# Patient Record
Sex: Female | Born: 1975 | Race: Black or African American | Hispanic: No | Marital: Single | Smoking: Never smoker
Health system: Southern US, Community
[De-identification: ages and names within clinical notes are randomized; demographics above are authoritative.]

## PROBLEM LIST (undated history)

## (undated) DIAGNOSIS — D649 Anemia, unspecified: Secondary | ICD-10-CM

## (undated) DIAGNOSIS — R7303 Prediabetes: Secondary | ICD-10-CM

## (undated) DIAGNOSIS — E669 Obesity, unspecified: Secondary | ICD-10-CM

## (undated) DIAGNOSIS — E559 Vitamin D deficiency, unspecified: Secondary | ICD-10-CM

## (undated) DIAGNOSIS — K219 Gastro-esophageal reflux disease without esophagitis: Secondary | ICD-10-CM

## (undated) DIAGNOSIS — E785 Hyperlipidemia, unspecified: Secondary | ICD-10-CM

## (undated) HISTORY — DX: Hyperlipidemia, unspecified: E78.5

## (undated) HISTORY — DX: Gastro-esophageal reflux disease without esophagitis: K21.9

## (undated) HISTORY — DX: Anemia, unspecified: D64.9

## (undated) HISTORY — DX: Prediabetes: R73.03

## (undated) HISTORY — DX: Obesity, unspecified: E66.9

## (undated) HISTORY — DX: Vitamin D deficiency, unspecified: E55.9

---

## 1998-02-28 ENCOUNTER — Other Ambulatory Visit: Admission: RE | Admit: 1998-02-28 | Discharge: 1998-02-28 | Payer: Self-pay | Admitting: *Deleted

## 2003-10-02 ENCOUNTER — Emergency Department (HOSPITAL_COMMUNITY): Admission: EM | Admit: 2003-10-02 | Discharge: 2003-10-02 | Payer: Self-pay | Admitting: Emergency Medicine

## 2004-11-02 ENCOUNTER — Emergency Department (HOSPITAL_COMMUNITY): Admission: EM | Admit: 2004-11-02 | Discharge: 2004-11-02 | Payer: Self-pay | Admitting: Emergency Medicine

## 2006-02-10 ENCOUNTER — Other Ambulatory Visit: Admission: RE | Admit: 2006-02-10 | Discharge: 2006-02-10 | Payer: Self-pay | Admitting: Internal Medicine

## 2007-02-21 ENCOUNTER — Other Ambulatory Visit: Admission: RE | Admit: 2007-02-21 | Discharge: 2007-02-21 | Payer: Self-pay | Admitting: Internal Medicine

## 2007-05-16 ENCOUNTER — Emergency Department (HOSPITAL_COMMUNITY): Admission: EM | Admit: 2007-05-16 | Discharge: 2007-05-16 | Payer: Self-pay | Admitting: *Deleted

## 2007-05-23 ENCOUNTER — Other Ambulatory Visit: Admission: RE | Admit: 2007-05-23 | Discharge: 2007-05-23 | Payer: Self-pay | Admitting: Internal Medicine

## 2008-04-11 ENCOUNTER — Other Ambulatory Visit: Admission: RE | Admit: 2008-04-11 | Discharge: 2008-04-11 | Payer: Self-pay | Admitting: Internal Medicine

## 2011-09-01 ENCOUNTER — Other Ambulatory Visit (HOSPITAL_COMMUNITY)
Admission: RE | Admit: 2011-09-01 | Discharge: 2011-09-01 | Disposition: A | Discharge status: Home / Self Care | Payer: BC Managed Care – PPO | Source: Ambulatory Visit | Attending: Internal Medicine | Admitting: Internal Medicine

## 2011-09-01 DIAGNOSIS — Z01419 Encounter for gynecological examination (general) (routine) without abnormal findings: Secondary | ICD-10-CM | POA: Insufficient documentation

## 2011-09-01 DIAGNOSIS — Z1159 Encounter for screening for other viral diseases: Secondary | ICD-10-CM | POA: Insufficient documentation

## 2011-09-01 DIAGNOSIS — N760 Acute vaginitis: Secondary | ICD-10-CM | POA: Insufficient documentation

## 2014-03-01 ENCOUNTER — Ambulatory Visit: Payer: Self-pay | Admitting: Physician Assistant

## 2014-03-01 ENCOUNTER — Ambulatory Visit (INDEPENDENT_AMBULATORY_CARE_PROVIDER_SITE_OTHER): Payer: BC Managed Care – PPO | Admitting: Physician Assistant

## 2014-03-01 ENCOUNTER — Encounter: Payer: Self-pay | Admitting: Physician Assistant

## 2014-03-01 VITALS — BP 110/68 | HR 88 | Temp 97.7°F | Resp 16 | Ht 65.0 in | Wt 199.0 lb

## 2014-03-01 DIAGNOSIS — R7303 Prediabetes: Secondary | ICD-10-CM

## 2014-03-01 DIAGNOSIS — D509 Iron deficiency anemia, unspecified: Secondary | ICD-10-CM

## 2014-03-01 DIAGNOSIS — E559 Vitamin D deficiency, unspecified: Secondary | ICD-10-CM

## 2014-03-01 DIAGNOSIS — R7309 Other abnormal glucose: Secondary | ICD-10-CM

## 2014-03-01 DIAGNOSIS — Z0000 Encounter for general adult medical examination without abnormal findings: Secondary | ICD-10-CM

## 2014-03-01 DIAGNOSIS — Z0001 Encounter for general adult medical examination with abnormal findings: Secondary | ICD-10-CM

## 2014-03-01 DIAGNOSIS — E669 Obesity, unspecified: Secondary | ICD-10-CM

## 2014-03-01 DIAGNOSIS — Z79899 Other long term (current) drug therapy: Secondary | ICD-10-CM

## 2014-03-01 DIAGNOSIS — E785 Hyperlipidemia, unspecified: Secondary | ICD-10-CM

## 2014-03-01 LAB — CBC WITH DIFFERENTIAL/PLATELET
BASOS ABS: 0 10*3/uL (ref 0.0–0.1)
Basophils Relative: 0 % (ref 0–1)
EOS ABS: 0.1 10*3/uL (ref 0.0–0.7)
Eosinophils Relative: 2 % (ref 0–5)
HCT: 35.7 % — ABNORMAL LOW (ref 36.0–46.0)
Hemoglobin: 11.3 g/dL — ABNORMAL LOW (ref 12.0–15.0)
LYMPHS PCT: 34 % (ref 12–46)
Lymphs Abs: 1.7 10*3/uL (ref 0.7–4.0)
MCH: 23.3 pg — ABNORMAL LOW (ref 26.0–34.0)
MCHC: 31.7 g/dL (ref 30.0–36.0)
MCV: 73.8 fL — AB (ref 78.0–100.0)
Monocytes Absolute: 0.4 10*3/uL (ref 0.1–1.0)
Monocytes Relative: 7 % (ref 3–12)
NEUTROS ABS: 2.9 10*3/uL (ref 1.7–7.7)
Neutrophils Relative %: 57 % (ref 43–77)
Platelets: 422 10*3/uL — ABNORMAL HIGH (ref 150–400)
RBC: 4.84 MIL/uL (ref 3.87–5.11)
RDW: 16.6 % — AB (ref 11.5–15.5)
WBC: 5 10*3/uL (ref 4.0–10.5)

## 2014-03-01 LAB — HEPATIC FUNCTION PANEL
ALK PHOS: 98 U/L (ref 39–117)
ALT: 10 U/L (ref 0–35)
AST: 12 U/L (ref 0–37)
Albumin: 4.1 g/dL (ref 3.5–5.2)
BILIRUBIN DIRECT: 0.1 mg/dL (ref 0.0–0.3)
BILIRUBIN INDIRECT: 0.4 mg/dL (ref 0.2–1.2)
Total Bilirubin: 0.5 mg/dL (ref 0.2–1.2)
Total Protein: 7.5 g/dL (ref 6.0–8.3)

## 2014-03-01 LAB — BASIC METABOLIC PANEL WITH GFR
BUN: 8 mg/dL (ref 6–23)
CALCIUM: 9.8 mg/dL (ref 8.4–10.5)
CHLORIDE: 99 meq/L (ref 96–112)
CO2: 26 mEq/L (ref 19–32)
Creat: 0.94 mg/dL (ref 0.50–1.10)
GFR, Est Non African American: 78 mL/min
GLUCOSE: 100 mg/dL — AB (ref 70–99)
POTASSIUM: 3.9 meq/L (ref 3.5–5.3)
Sodium: 137 mEq/L (ref 135–145)

## 2014-03-01 LAB — TSH: TSH: 1.46 u[IU]/mL (ref 0.350–4.500)

## 2014-03-01 LAB — LIPID PANEL
Cholesterol: 200 mg/dL (ref 0–200)
HDL: 45 mg/dL (ref >39)
LDL Cholesterol: 119 mg/dL — ABNORMAL HIGH (ref 0–99)
Total CHOL/HDL Ratio: 4.4 Ratio
Triglycerides: 178 mg/dL — ABNORMAL HIGH (ref <150)
VLDL: 36 mg/dL (ref 0–40)

## 2014-03-01 LAB — MAGNESIUM: MAGNESIUM: 1.8 mg/dL (ref 1.5–2.5)

## 2014-03-01 LAB — HEMOGLOBIN A1C
HEMOGLOBIN A1C: 6.2 % — AB (ref <5.7)
MEAN PLASMA GLUCOSE: 131 mg/dL — AB (ref <117)

## 2014-03-01 MED ORDER — ACYCLOVIR 400 MG PO TABS: 400.0000 mg | ORAL_TABLET | Freq: Every day | ORAL | Status: DC

## 2014-03-01 NOTE — Patient Instructions (Signed)
   Bad carbs also include fruit juice, alcohol, and sweet tea. These are empty calories that do not signal to your brain that you are full.   Please remember the good carbs are still carbs which convert into sugar. So please measure them out no more than 1/2-1 cup of rice, oatmeal, pasta, and beans.  Veggies are however free foods! Pile them on.   I like lean protein at every meal such as chicken, [place], pork chops, cottage cheese, etc. Just do not fry these meats and please center your meal around vegetable, the meats should be a side dish.   No all fruit is created equal. Please see the list below, the fruit at the bottom is higher in sugars than the fruit at the top   Eardrum Perforation The eardrum is a thin, round tissue inside the ear that separates the ear canal from the middle ear. This is the tissue that detects sound and enables you to hear. The eardrum can be punctured or torn (perforated). Eardrums generally heal without help and with little or no permanent hearing loss. HOME CARE INSTRUCTIONS   Keep your ear dry, as this improves healing. Swimming, diving, and showers are not allowed until healing is complete. While bathing, protect the ear by placing a piece of cotton covered with petroleum jelly in the outer ear canal.  Only take over-the-counter or prescription medicines for pain, discomfort, or fever as directed by your caregiver.  Blow your nose gently. Forceful blowing increases the pressure in the middle ear and may cause further injury or delay healing.  Resume normal activities, such as showering, when the perforation has healed. Your caregiver can let you know when this has occurred.  Talk to your caregiver before flying on an airplane. Air travel is generally allowed with a perforated eardrum.  If your caregiver has given you a follow-up appointment, it is very important to keep that appointment. Failure to keep the appointment could result in a chronic or permanent  injury, pain, hearing loss, and disability. SEEK IMMEDIATE MEDICAL CARE IF:   You have bleeding or pus coming from your ear.  You have problems with balance, dizziness, nausea, or vomiting.  You develop increased pain.  You have a fever. MAKE SURE YOU:   Understand these instructions.  Will watch your condition.  Will get help right away if you are not doing well or get worse. Document Released: 06/12/2000 Document Revised: 09/07/2011 Document Reviewed: 06/14/2008 [facility] Patient Information 2015 [place], [place]. This information is not intended to replace advice given to you by your health care provider. Make sure you discuss any questions you have with your health care provider.

## 2014-03-01 NOTE — Progress Notes (Addendum)
Assessment and Plan:  Hypertension: Continue medication, monitor blood pressure at home. Continue DASH diet. Cholesterol: Continue diet and exercise. Check cholesterol.  Pre-diabetes-Continue diet and exercise. Check A1C Vitamin D Def- check level and continue medications.  Anemia- check CBC HSV1- acyclovir sent in Obesity with co morbidities- long discussion about weight loss, diet, and exercise- STOP sodas   Continue diet and meds as discussed. Further disposition pending results of labs.  HPI 38 y.o. female  presents for complete physical and follow up with hypertension, hyperlipidemia, prediabetes and vitamin D. Her blood pressure has been controlled at home, today their BP is BP: 110/68 mmHg She does not workout. She denies chest pain, shortness of breath, dizziness.  She is not on cholesterol medication and denies myalgias. Her cholesterol is at goal. The cholesterol last visit was:  LDL 129 She has been working on diet and exercise for prediabetes, and denies polydipsia, polyuria and visual disturbances. Last A1C in the office was: 6.2 Patient is on Vitamin D supplement.  Last vitamin D 20.  She has iron def anemia with last iron 18, H/H was 10.5/32.4. LMP 3 weeks ago, heavy for first day or 2 but not bad. BMI is Body mass index is 33.12 kg/(m^2)., She is struggling with weight loss due to stress.  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   Current Medications:  No current outpatient prescriptions on file prior to visit.   No current facility-administered medications on file prior to visit.   Medical History:  Past Medical History  Diagnosis Date  . Prediabetes   . Hyperlipemia   . Vitamin D deficiency   . Obesity   . Anemia   . GERD (gastroesophageal reflux disease)    Allergies:  Allergies  Allergen Reactions  . Benadryl [Diphenhydramine] Rash    No past surgical history on  file.  Family History  Problem Relation Age of Onset  . Asthma Mother   . Hypertension Mother   . Hypertension Father   . Cancer Father     Prostate  . Stroke Paternal Grandfather    Allergies  Allergen Reactions  . Benadryl [Diphenhydramine] Rash   History   Social History  . Marital Status: Single    Spouse Name: N/A    Number of Children: N/A  . Years of Education: N/A   Occupational History  . Not on file.   Social History Main Topics  . Smoking status: Never Smoker   . Smokeless tobacco: Never Used  . Alcohol Use: No  . Drug Use: No  . Sexual Activity: Not on file   Other Topics Concern  . Not on file   Social History Narrative     Review of Systems:  = complains of   = denies  General: Fatigue  Fever  Chills  Weakness   Insomnia  Eyes: Redness  Blurred vision  Diplopia   ENT: Congestion  Sinus Pain  Post Nasal Drip  Sore Throat  Earache [x ]  Cardiac: Chest pain/pressure  SOB  Orthopnea   Palpitations   Paroxysmal nocturnal dyspnea[ ]  Claudication  Edema   Pulmonary: Cough  Wheezing[ ]   SOB   Snoring   GI: Nausea  Vomiting[ ]  Dysphagia[ ]  Heartburn[ ]  Abdominal pain  Constipation ;  Diarrhea ; BRBPR  Melena[ ]  GU: Hematuria[ ]  Dysuria  Nocturia[ ]  Urgency   Hesitancy  Discharge  Neuro: Headaches[ ]  Vertigo[ ]  Paresthesias[ ]  Spasm  Speech changes  Incoordination   Ortho: Arthritis  Joint pain  Muscle pain  Joint swelling  Back Pain  Skin:  Rash   Pruritis  Change in skin lesion   Psych: Depression[ ]  Anxiety[ ]  Confusion  Memory loss   Heme/Lypmh: Bleeding  Bruising  Enlarged lymph nodes   Endocrine: Visual blurring  Paresthesia  Polyuria  Polydypsea    Heat/cold intolerance  Hypoglycemia   Physical Exam: BP 110/68 mmHg  Pulse 88  Temp(Src) 97.7 F (36.5 C)  Resp 16  Ht  (1.651 m)  Wt  199 lb (90.266 kg)  BMI 33.12 kg/m2  LMP 02/15/2014 Wt Readings from Last 3 Encounters:  03/01/14 199 lb (90.266 kg)   General Appearance: Well nourished, in no apparent distress. Eyes: PERRLA, EOMs, conjunctiva no swelling or erythema Sinuses: No Frontal/maxillary tenderness ENT/Mouth: Ext aud canals clear, TMs without erythema, bulging. No erythema, swelling, or exudate on post pharynx.  Tonsils not swollen or erythematous. Hearing normal.  Neck: Supple, thyroid normal.  Respiratory: Respiratory effort normal, BS equal bilaterally without rales, rhonchi, wheezing or stridor.  Cardio: RRR with no MRGs. Brisk peripheral pulses without edema.  Abdomen: Soft, + BS.  Non tender, no guarding, rebound, hernias, masses. Lymphatics: Non tender without lymphadenopathy.  Musculoskeletal: Full ROM, 5/5 strength, normal gait.  Skin: Warm, dry without rashes, lesions, ecchymosis.  Neuro: Cranial nerves intact. Normal muscle tone, no cerebellar symptoms. Sensation intact.  Psych: Awake and oriented X 3, normal affect, Insight and Judgment appropriate.    Quentin Mulling 1:22 PM

## 2014-03-02 LAB — VITAMIN D 25 HYDROXY (VIT D DEFICIENCY, FRACTURES): VIT D 25 HYDROXY: 19 ng/mL — AB (ref 30–89)

## 2014-05-31 ENCOUNTER — Ambulatory Visit: Payer: Self-pay | Admitting: Physician Assistant

## 2014-06-04 ENCOUNTER — Encounter: Payer: Self-pay | Admitting: Physician Assistant

## 2014-06-04 NOTE — Addendum Note (Signed)
Addended by: Quentin MullingOLLIER, Zakiah Beckerman R on: 06/04/2014 01:23 PM   Modules accepted: Level of Service

## 2015-03-26 ENCOUNTER — Other Ambulatory Visit: Payer: Self-pay | Admitting: Physician Assistant

## 2015-03-26 MED ORDER — ACYCLOVIR 400 MG PO TABS: 400.0000 mg | ORAL_TABLET | Freq: Every day | ORAL | Status: DC

## 2015-03-27 ENCOUNTER — Other Ambulatory Visit: Payer: Self-pay

## 2015-04-01 ENCOUNTER — Other Ambulatory Visit (HOSPITAL_COMMUNITY)
Admission: RE | Admit: 2015-04-01 | Discharge: 2015-04-01 | Disposition: A | Discharge status: Home / Self Care | Payer: BLUE CROSS/BLUE SHIELD | Source: Ambulatory Visit | Attending: Physician Assistant | Admitting: Physician Assistant

## 2015-04-01 ENCOUNTER — Ambulatory Visit (INDEPENDENT_AMBULATORY_CARE_PROVIDER_SITE_OTHER): Payer: BLUE CROSS/BLUE SHIELD | Admitting: Physician Assistant

## 2015-04-01 ENCOUNTER — Encounter: Payer: Self-pay | Admitting: Physician Assistant

## 2015-04-01 VITALS — BP 128/80 | HR 88 | Temp 97.5°F | Resp 14 | Ht 65.0 in | Wt 192.0 lb

## 2015-04-01 DIAGNOSIS — Z23 Encounter for immunization: Secondary | ICD-10-CM

## 2015-04-01 DIAGNOSIS — Z1151 Encounter for screening for human papillomavirus (HPV): Secondary | ICD-10-CM | POA: Insufficient documentation

## 2015-04-01 DIAGNOSIS — Z79899 Other long term (current) drug therapy: Secondary | ICD-10-CM | POA: Diagnosis not present

## 2015-04-01 DIAGNOSIS — E785 Hyperlipidemia, unspecified: Secondary | ICD-10-CM

## 2015-04-01 DIAGNOSIS — E669 Obesity, unspecified: Secondary | ICD-10-CM

## 2015-04-01 DIAGNOSIS — Z01411 Encounter for gynecological examination (general) (routine) with abnormal findings: Secondary | ICD-10-CM | POA: Diagnosis present

## 2015-04-01 DIAGNOSIS — E559 Vitamin D deficiency, unspecified: Secondary | ICD-10-CM | POA: Diagnosis not present

## 2015-04-01 DIAGNOSIS — B009 Herpesviral infection, unspecified: Secondary | ICD-10-CM

## 2015-04-01 DIAGNOSIS — Z124 Encounter for screening for malignant neoplasm of cervix: Secondary | ICD-10-CM | POA: Diagnosis not present

## 2015-04-01 DIAGNOSIS — R87612 Low grade squamous intraepithelial lesion on cytologic smear of cervix (LGSIL): Secondary | ICD-10-CM

## 2015-04-01 DIAGNOSIS — K219 Gastro-esophageal reflux disease without esophagitis: Secondary | ICD-10-CM | POA: Insufficient documentation

## 2015-04-01 DIAGNOSIS — R7303 Prediabetes: Secondary | ICD-10-CM | POA: Insufficient documentation

## 2015-04-01 DIAGNOSIS — Z0001 Encounter for general adult medical examination with abnormal findings: Secondary | ICD-10-CM

## 2015-04-01 DIAGNOSIS — D509 Iron deficiency anemia, unspecified: Secondary | ICD-10-CM

## 2015-04-01 DIAGNOSIS — Z0000 Encounter for general adult medical examination without abnormal findings: Secondary | ICD-10-CM | POA: Diagnosis not present

## 2015-04-01 LAB — IRON AND TIBC
%SAT: 7 % — ABNORMAL LOW (ref 11–50)
Iron: 30 ug/dL — ABNORMAL LOW (ref 40–190)
TIBC: 433 ug/dL (ref 250–450)
UIBC: 403 ug/dL — ABNORMAL HIGH (ref 125–400)

## 2015-04-01 LAB — CBC WITH DIFFERENTIAL/PLATELET
Basophils Absolute: 0.1 K/uL (ref 0.0–0.1)
Basophils Relative: 1 % (ref 0–1)
Eosinophils Absolute: 0.1 K/uL (ref 0.0–0.7)
Eosinophils Relative: 2 % (ref 0–5)
HCT: 34.8 % — ABNORMAL LOW (ref 36.0–46.0)
Hemoglobin: 10.8 g/dL — ABNORMAL LOW (ref 12.0–15.0)
Lymphocytes Relative: 40 % (ref 12–46)
Lymphs Abs: 2.2 K/uL (ref 0.7–4.0)
MCH: 23.1 pg — ABNORMAL LOW (ref 26.0–34.0)
MCHC: 31 g/dL (ref 30.0–36.0)
MCV: 74.5 fL — ABNORMAL LOW (ref 78.0–100.0)
MPV: 9.8 fL (ref 8.6–12.4)
Monocytes Absolute: 0.4 K/uL (ref 0.1–1.0)
Monocytes Relative: 8 % (ref 3–12)
Neutro Abs: 2.7 K/uL (ref 1.7–7.7)
Neutrophils Relative %: 49 % (ref 43–77)
Platelets: 438 K/uL — ABNORMAL HIGH (ref 150–400)
RBC: 4.67 MIL/uL (ref 3.87–5.11)
RDW: 16.3 % — ABNORMAL HIGH (ref 11.5–15.5)
WBC: 5.5 K/uL (ref 4.0–10.5)

## 2015-04-01 LAB — HEPATIC FUNCTION PANEL
ALT: 9 U/L (ref 6–29)
AST: 14 U/L (ref 10–30)
Albumin: 4.2 g/dL (ref 3.6–5.1)
Alkaline Phosphatase: 90 U/L (ref 33–115)
Bilirubin, Direct: 0.1 mg/dL
Indirect Bilirubin: 0.3 mg/dL (ref 0.2–1.2)
Total Bilirubin: 0.4 mg/dL (ref 0.2–1.2)
Total Protein: 7.7 g/dL (ref 6.1–8.1)

## 2015-04-01 LAB — BASIC METABOLIC PANEL WITHOUT GFR
BUN: 9 mg/dL (ref 7–25)
CO2: 26 mmol/L (ref 20–31)
Calcium: 9.8 mg/dL (ref 8.6–10.2)
Chloride: 104 mmol/L (ref 98–110)
Creat: 0.86 mg/dL (ref 0.50–1.10)
GFR, Est African American: >89 mL/min
GFR, Est Non African American: 87 mL/min
Glucose, Bld: 77 mg/dL (ref 65–99)
Potassium: 4.3 mmol/L (ref 3.5–5.3)
Sodium: 142 mmol/L (ref 135–146)

## 2015-04-01 LAB — LIPID PANEL
Cholesterol: 203 mg/dL — ABNORMAL HIGH (ref 125–200)
HDL: 40 mg/dL — ABNORMAL LOW
LDL Cholesterol: 133 mg/dL — ABNORMAL HIGH
Total CHOL/HDL Ratio: 5.1 ratio — ABNORMAL HIGH
Triglycerides: 148 mg/dL
VLDL: 30 mg/dL

## 2015-04-01 LAB — HEMOGLOBIN A1C
Hgb A1c MFr Bld: 6 % — ABNORMAL HIGH
Mean Plasma Glucose: 126 mg/dL — ABNORMAL HIGH

## 2015-04-01 LAB — MAGNESIUM: Magnesium: 2 mg/dL (ref 1.5–2.5)

## 2015-04-01 MED ORDER — ACYCLOVIR 400 MG PO TABS: 400.0000 mg | ORAL_TABLET | Freq: Every day | ORAL | Status: DC

## 2015-04-01 NOTE — Addendum Note (Signed)
Addended by: Quentin Mulling R on: 04/01/2015 12:39 PM   Modules accepted: Karina Berry

## 2015-04-01 NOTE — Addendum Note (Signed)
Addended by: Quentin Mulling R on: 04/01/2015 12:40 PM   Modules accepted: Kipp Brood
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
## 2015-04-01 NOTE — Patient Instructions (Signed)
Preventive Care for Adults A healthy lifestyle and preventive care can promote health and wellness. Preventive health guidelines for women include the following key practices.  A routine yearly physical is a good way to check with your health care provider about your health and preventive screening. It is a chance to share any concerns and updates on your health and to receive a thorough exam.  Visit your dentist for a routine exam and preventive care every 6 months. Brush your teeth twice a day and floss once a day. Good oral hygiene prevents tooth decay and gum disease.  The frequency of eye exams is based on your age, health, family medical history, use of contact lenses, and other factors. Follow your health care provider's recommendations for frequency of eye exams.  Eat a healthy diet. Foods like vegetables, fruits, whole grains, low-fat dairy products, and lean protein foods contain the nutrients you need without too many calories. Decrease your intake of foods high in solid fats, added sugars, and salt. Eat the right amount of calories for you.Get information about a proper diet from your health care provider, if necessary.  Regular physical exercise is one of the most important things you can do for your health. Most adults should get at least 150 minutes of moderate-intensity exercise (any activity that increases your heart rate and causes you to sweat) each week. In addition, most adults need muscle-strengthening exercises on 2 or more days a week.  Maintain a healthy weight. The body mass index (BMI) is a screening tool to identify possible weight problems. It provides an estimate of body fat based on height and weight. Your health care provider can find your BMI and can help you achieve or maintain a healthy weight.For adults 20 years and older:  A BMI below 18.5 is considered underweight.  A BMI of 18.5 to 24.9 is normal.  A BMI of 25 to 29.9 is considered overweight.  A BMI of  30 and above is considered obese.  Maintain normal blood lipids and cholesterol levels by exercising and minimizing your intake of saturated fat. Eat a balanced diet with plenty of fruit and vegetables. Blood tests for lipids and cholesterol should begin at age 76 and be repeated every 5 years. If your lipid or cholesterol levels are high, you are over 50, or you are at high risk for heart disease, you may need your cholesterol levels checked more frequently.Ongoing high lipid and cholesterol levels should be treated with medicines if diet and exercise are not working.  If you smoke, find out from your health care provider how to quit. If you do not use tobacco, do not start.  Lung cancer screening is recommended for adults aged 22-80 years who are at high risk for developing lung cancer because of a history of smoking. A yearly low-dose CT scan of the lungs is recommended for people who have at least a 30-pack-year history of smoking and are a current smoker or have quit within the past 15 years. A pack year of smoking is smoking an average of 1 pack of cigarettes a day for 1 year (for example: 1 pack a day for 30 years or 2 packs a day for 15 years). Yearly screening should continue until the smoker has stopped smoking for at least 15 years. Yearly screening should be stopped for people who develop a health problem that would prevent them from having lung cancer treatment.  If you are pregnant, do not drink alcohol. If you are breastfeeding,  be very cautious about drinking alcohol. If you are not pregnant and choose to drink alcohol, do not have more than 1 drink per day. One drink is considered to be 12 ounces (355 mL) of beer, 5 ounces (148 mL) of wine, or 1.5 ounces (44 mL) of liquor.  Avoid use of street drugs. Do not share needles with anyone. Ask for help if you need support or instructions about stopping the use of drugs.  High blood pressure causes heart disease and increases the risk of  stroke. Your blood pressure should be checked at least every 1 to 2 years. Ongoing high blood pressure should be treated with medicines if weight loss and exercise do not work.  If you are 3-86 years old, ask your health care provider if you should take aspirin to prevent strokes.  Diabetes screening involves taking a blood sample to check your fasting blood sugar level. This should be done once every 3 years, after age 67, if you are within normal weight and without risk factors for diabetes. Testing should be considered at a younger age or be carried out more frequently if you are overweight and have at least 1 risk factor for diabetes.  Breast cancer screening is essential preventive care for women. You should practice "breast self-awareness." This means understanding the normal appearance and feel of your breasts and may include breast self-examination. Any changes detected, no matter how small, should be reported to a health care provider. Women in their 8s and 30s should have a clinical breast exam (CBE) by a health care provider as part of a regular health exam every 1 to 3 years. After age 70, women should have a CBE every year. Starting at age 25, women should consider having a mammogram (breast X-ray test) every year. Women who have a family history of breast cancer should talk to their health care provider about genetic screening. Women at a high risk of breast cancer should talk to their health care providers about having an MRI and a mammogram every year.  Breast cancer gene (BRCA)-related cancer risk assessment is recommended for women who have family members with BRCA-related cancers. BRCA-related cancers include breast, ovarian, tubal, and peritoneal cancers. Having family members with these cancers may be associated with an increased risk for harmful changes (mutations) in the breast cancer genes BRCA1 and BRCA2. Results of the assessment will determine the need for genetic counseling and  BRCA1 and BRCA2 testing.  Routine pelvic exams to screen for cancer are no longer recommended for nonpregnant women who are considered low risk for cancer of the pelvic organs (ovaries, uterus, and vagina) and who do not have symptoms. Ask your health care provider if a screening pelvic exam is right for you.  If you have had past treatment for cervical cancer or a condition that could lead to cancer, you need Pap tests and screening for cancer for at least 20 years after your treatment. If Pap tests have been discontinued, your risk factors (such as having a new sexual partner) need to be reassessed to determine if screening should be resumed. Some women have medical problems that increase the chance of getting cervical cancer. In these cases, your health care provider may recommend more frequent screening and Pap tests.  The HPV test is an additional test that may be used for cervical cancer screening. The HPV test looks for the virus that can cause the cell changes on the cervix. The cells collected during the Pap test can be  tested for HPV. The HPV test could be used to screen women aged 30 years and older, and should be used in women of any age who have unclear Pap test results. After the age of 30, women should have HPV testing at the same frequency as a Pap test.  Colorectal cancer can be detected and often prevented. Most routine colorectal cancer screening begins at the age of 50 years and continues through age 75 years. However, your health care provider may recommend screening at an earlier age if you have risk factors for colon cancer. On a yearly basis, your health care provider may provide home test kits to check for hidden blood in the stool. Use of a small camera at the end of a tube, to directly examine the colon (sigmoidoscopy or colonoscopy), can detect the earliest forms of colorectal cancer. Talk to your health care provider about this at age 50, when routine screening begins. Direct  exam of the colon should be repeated every 5-10 years through age 75 years, unless early forms of pre-cancerous polyps or small growths are found.  People who are at an increased risk for hepatitis B should be screened for this virus. You are considered at high risk for hepatitis B if:  You were born in a country where hepatitis B occurs often. Talk with your health care provider about which countries are considered high risk.  Your parents were born in a high-risk country and you have not received a shot to protect against hepatitis B (hepatitis B vaccine).  You have HIV or AIDS.  You use needles to inject street drugs.  You live with, or have sex with, someone who has hepatitis B.  You get hemodialysis treatment.  You take certain medicines for conditions like cancer, organ transplantation, and autoimmune conditions.  Hepatitis C blood testing is recommended for all people born from 1945 through 1965 and any individual with known risks for hepatitis C.  Practice safe sex. Use condoms and avoid high-risk sexual practices to reduce the spread of sexually transmitted infections (STIs). STIs include gonorrhea, chlamydia, syphilis, trichomonas, herpes, HPV, and human immunodeficiency virus (HIV). Herpes, HIV, and HPV are viral illnesses that have no cure. They can result in disability, cancer, and death.  You should be screened for sexually transmitted illnesses (STIs) including gonorrhea and chlamydia if:  You are sexually active and are younger than 24 years.  You are older than 24 years and your health care provider tells you that you are at risk for this type of infection.  Your sexual activity has changed since you were last screened and you are at an increased risk for chlamydia or gonorrhea. Ask your health care provider if you are at risk.  If you are at risk of being infected with HIV, it is recommended that you take a prescription medicine daily to prevent HIV infection. This is  called preexposure prophylaxis (PrEP). You are considered at risk if:  You are a heterosexual woman, are sexually active, and are at increased risk for HIV infection.  You take drugs by injection.  You are sexually active with a partner who has HIV.  Talk with your health care provider about whether you are at high risk of being infected with HIV. If you choose to begin PrEP, you should first be tested for HIV. You should then be tested every 3 months for as long as you are taking PrEP.  Osteoporosis is a disease in which the bones lose minerals and strength   with aging. This can result in serious bone fractures or breaks. The risk of osteoporosis can be identified using a bone density scan. Women ages 65 years and over and women at risk for fractures or osteoporosis should discuss screening with their health care providers. Ask your health care provider whether you should take a calcium supplement or vitamin D to reduce the rate of osteoporosis.  Menopause can be associated with physical symptoms and risks. Hormone replacement therapy is available to decrease symptoms and risks. You should talk to your health care provider about whether hormone replacement therapy is right for you.  Use sunscreen. Apply sunscreen liberally and repeatedly throughout the day. You should seek shade when your shadow is shorter than you. Protect yourself by wearing long sleeves, pants, a wide-brimmed hat, and sunglasses year round, whenever you are outdoors.  Once a month, do a whole body skin exam, using a mirror to look at the skin on your back. Tell your health care provider of new moles, moles that have irregular borders, moles that are larger than a pencil eraser, or moles that have changed in shape or color.  Stay current with required vaccines (immunizations).  Influenza vaccine. All adults should be immunized every year.  Tetanus, diphtheria, and acellular pertussis (Td, Tdap) vaccine. Pregnant women should  receive 1 dose of Tdap vaccine during each pregnancy. The dose should be obtained regardless of the length of time since the last dose. Immunization is preferred during the 27th-36th week of gestation. An adult who has not previously received Tdap or who does not know her vaccine status should receive 1 dose of Tdap. This initial dose should be followed by tetanus and diphtheria toxoids (Td) booster doses every 10 years. Adults with an unknown or incomplete history of completing a 3-dose immunization series with Td-containing vaccines should begin or complete a primary immunization series including a Tdap dose. Adults should receive a Td booster every 10 years.  Varicella vaccine. An adult without evidence of immunity to varicella should receive 2 doses or a second dose if she has previously received 1 dose. Pregnant females who do not have evidence of immunity should receive the first dose after pregnancy. This first dose should be obtained before leaving the health care facility. The second dose should be obtained 4-8 weeks after the first dose.  Human papillomavirus (HPV) vaccine. Females aged 13-26 years who have not received the vaccine previously should obtain the 3-dose series. The vaccine is not recommended for use in pregnant females. However, pregnancy testing is not needed before receiving a dose. If a female is found to be pregnant after receiving a dose, no treatment is needed. In that case, the remaining doses should be delayed until after the pregnancy. Immunization is recommended for any person with an immunocompromised condition through the age of 26 years if she did not get any or all doses earlier. During the 3-dose series, the second dose should be obtained 4-8 weeks after the first dose. The third dose should be obtained 24 weeks after the first dose and 16 weeks after the second dose.  Zoster vaccine. One dose is recommended for adults aged 60 years or older unless certain conditions are  present.  Measles, mumps, and rubella (MMR) vaccine. Adults born before 1957 generally are considered immune to measles and mumps. Adults born in 1957 or later should have 1 or more doses of MMR vaccine unless there is a contraindication to the vaccine or there is laboratory evidence of immunity to   each of the three diseases. A routine second dose of MMR vaccine should be obtained at least 28 days after the first dose for students attending postsecondary schools, health care workers, or international travelers. People who received inactivated measles vaccine or an unknown type of measles vaccine during 1963-1967 should receive 2 doses of MMR vaccine. People who received inactivated mumps vaccine or an unknown type of mumps vaccine before 1979 and are at high risk for mumps infection should consider immunization with 2 doses of MMR vaccine. For females of childbearing age, rubella immunity should be determined. If there is no evidence of immunity, females who are not pregnant should be vaccinated. If there is no evidence of immunity, females who are pregnant should delay immunization until after pregnancy. Unvaccinated health care workers born before 1957 who lack laboratory evidence of measles, mumps, or rubella immunity or laboratory confirmation of disease should consider measles and mumps immunization with 2 doses of MMR vaccine or rubella immunization with 1 dose of MMR vaccine.  Pneumococcal 13-valent conjugate (PCV13) vaccine. When indicated, a person who is uncertain of her immunization history and has no record of immunization should receive the PCV13 vaccine. An adult aged 19 years or older who has certain medical conditions and has not been previously immunized should receive 1 dose of PCV13 vaccine. This PCV13 should be followed with a dose of pneumococcal polysaccharide (PPSV23) vaccine. The PPSV23 vaccine dose should be obtained at least 8 weeks after the dose of PCV13 vaccine. An adult aged 19  years or older who has certain medical conditions and previously received 1 or more doses of PPSV23 vaccine should receive 1 dose of PCV13. The PCV13 vaccine dose should be obtained 1 or more years after the last PPSV23 vaccine dose.  Pneumococcal polysaccharide (PPSV23) vaccine. When PCV13 is also indicated, PCV13 should be obtained first. All adults aged 65 years and older should be immunized. An adult younger than age 65 years who has certain medical conditions should be immunized. Any person who resides in a nursing home or long-term care facility should be immunized. An adult smoker should be immunized. People with an immunocompromised condition and certain other conditions should receive both PCV13 and PPSV23 vaccines. People with human immunodeficiency virus (HIV) infection should be immunized as soon as possible after diagnosis. Immunization during chemotherapy or radiation therapy should be avoided. Routine use of PPSV23 vaccine is not recommended for American Indians, [place] Natives, or people younger than 65 years unless there are medical conditions that require PPSV23 vaccine. When indicated, people who have unknown immunization and have no record of immunization should receive PPSV23 vaccine. One-time revaccination 5 years after the first dose of PPSV23 is recommended for people aged 19-64 years who have chronic kidney failure, nephrotic syndrome, asplenia, or immunocompromised conditions. People who received 1-2 doses of PPSV23 before age 65 years should receive another dose of PPSV23 vaccine at age 65 years or later if at least 5 years have passed since the previous dose. Doses of PPSV23 are not needed for people immunized with PPSV23 at or after age 65 years.  Meningococcal vaccine. Adults with asplenia or persistent complement component deficiencies should receive 2 doses of quadrivalent meningococcal conjugate (MenACWY-D) vaccine. The doses should be obtained at least 2 months apart.  Microbiologists working with certain meningococcal bacteria, military recruits, people at risk during an outbreak, and people who travel to or live in countries with a high rate of meningitis should be immunized. A first-year college student up through age   21 years who is living in a residence hall should receive a dose if she did not receive a dose on or after her 16th birthday. Adults who have certain high-risk conditions should receive one or more doses of vaccine.  Hepatitis A vaccine. Adults who wish to be protected from this disease, have certain high-risk conditions, work with hepatitis A-infected animals, work in hepatitis A research labs, or travel to or work in countries with a high rate of hepatitis A should be immunized. Adults who were previously unvaccinated and who anticipate close contact with an international adoptee during the first 60 days after arrival in the [place] from a country with a high rate of hepatitis A should be immunized.  Hepatitis B vaccine. Adults who wish to be protected from this disease, have certain high-risk conditions, may be exposed to blood or other infectious body fluids, are household contacts or sex partners of hepatitis B positive people, are clients or workers in certain care facilities, or travel to or work in countries with a high rate of hepatitis B should be immunized.  Haemophilus influenzae type b (Hib) vaccine. A previously unvaccinated person with asplenia or sickle cell disease or having a scheduled splenectomy should receive 1 dose of Hib vaccine. Regardless of previous immunization, a recipient of a hematopoietic stem cell transplant should receive a 3-dose series 6-12 months after her successful transplant. Hib vaccine is not recommended for adults with HIV infection. Preventive Services / Frequency  Ages 19 to 39 years 1. Blood pressure check. 2. Lipid and cholesterol check. 3. Clinical breast exam.** / Every 3 years for women in their  20s and 30s. 4. BRCA-related cancer risk assessment.** / For women who have family members with a BRCA-related cancer (breast, ovarian, tubal, or peritoneal cancers). 5. Pap test.** / Every 2 years from ages 21 through 29. Every 3 years starting at age 30 through age 65 or 70 with a history of 3 consecutive normal Pap tests. 6. HPV screening.** / Every 3 years from ages 30 through ages 65 to 70 with a history of 3 consecutive normal Pap tests. 7. Hepatitis C blood test.** / For any individual with known risks for hepatitis C. 8. Skin self-exam. / Monthly. 9. Influenza vaccine. / Every year. 10. Tetanus, diphtheria, and acellular pertussis (Tdap, Td) vaccine.** / Consult your health care provider. Pregnant women should receive 1 dose of Tdap vaccine during each pregnancy. 1 dose of Td every 10 years. 11. Varicella vaccine.** / Consult your health care provider. Pregnant females who do not have evidence of immunity should receive the first dose after pregnancy. 12. HPV vaccine. / 3 doses over 6 months, if 26 and younger. The vaccine is not recommended for use in pregnant females. However, pregnancy testing is not needed before receiving a dose. 13. Measles, mumps, rubella (MMR) vaccine.** / You need at least 1 dose of MMR if you were born in 1957 or later. You may also need a 2nd dose. For females of childbearing age, rubella immunity should be determined. If there is no evidence of immunity, females who are not pregnant should be vaccinated. If there is no evidence of immunity, females who are pregnant should delay immunization until after pregnancy. 14. Pneumococcal 13-valent conjugate (PCV13) vaccine.** / Consult your health care provider. 15. Pneumococcal polysaccharide (PPSV23) vaccine.** / 1 to 2 doses if you smoke cigarettes or if you have certain conditions. 16. Meningococcal vaccine.** / 1 dose if you are age 19 to 21 years and a   first-year college student living in a residence hall, or have one  of several medical conditions, you need to get vaccinated against meningococcal disease. You may also need additional booster doses. 17. Hepatitis A vaccine.** / Consult your health care provider. 18. Hepatitis B vaccine.** / Consult your health care provider. 19. Haemophilus influenzae type b (Hib) vaccine.** / Consult your health care provider.  24. Chlamydia, HIV, and other sexually transmitted diseases- Get screened every year until age 54, then within three months of each new sexual provider. 21. Pap Smear- Every 1-3 years; discuss with your health care provider. 28. Mammogram- Every year starting at age 40  Take these steps 1. Do not smoke-Your healthcare provider can help you quit.  For tips on how to quit go to www.smokefree.gov or call 1-800 QUITNOW. 2. Be physically active- Exercise 5 days a week for at least 30 minutes.  If you are not already physically active, start slow and gradually work up to 30 minutes of moderate physical activity.  Examples of moderate activity include walking briskly, dancing, swimming, bicycling, etc. 3. Breast Cancer- A self breast exam every month is important for early detection of breast cancer.  For more information and instruction on self breast exams, ask your healthcare provider or https://www.patel.info/. 4. Eat a healthy diet- Eat a variety of healthy foods such as fruits, vegetables, whole grains, low fat milk, low fat cheeses, yogurt, lean meats, poultry and fish, beans, nuts, tofu, etc.  For more information go to www. Thenutritionsource.org 5. Drink alcohol in moderation- Limit alcohol intake to one drink or less per day. Never drink and drive. 6. Depression- Your emotional health is as important as your physical health.  If you're feeling down or losing interest in things you normally enjoy please talk to your healthcare provider about being screened for depression. 7. Dental visit- Brush and floss your teeth twice daily;  visit your dentist twice a year. 8. Eye doctor- Get an eye exam at least every 2 years. 9. Helmet use- Always wear a helmet when riding a bicycle, motorcycle, rollerblading or skateboarding. 6. Safe sex- If you may be exposed to sexually transmitted infections, use a condom. 11. Seat belts- Seat belts can save your live; always wear one. 12. Smoke/Carbon Monoxide detectors- These detectors need to be installed on the appropriate level of your home. Replace batteries at least once a year. 13. Skin cancer- When out in the sun please cover up and use sunscreen 15 SPF or higher. 14. Violence- If anyone is threatening or hurting you, please tell your healthcare provider.

## 2015-04-02 LAB — TSH: TSH: 1.887 u[IU]/mL (ref 0.350–4.500)

## 2015-04-02 LAB — FERRITIN: Ferritin: 11 ng/mL (ref 10–291)

## 2015-04-02 LAB — VITAMIN D 25 HYDROXY (VIT D DEFICIENCY, FRACTURES): Vit D, 25-Hydroxy: 9 ng/mL — ABNORMAL LOW (ref 30–100)

## 2015-04-02 LAB — VITAMIN B12: VITAMIN B 12: 382 pg/mL (ref 211–911)

## 2015-04-08 LAB — CYTOLOGY - PAP

## 2015-04-10 DIAGNOSIS — R87612 Low grade squamous intraepithelial lesion on cytologic smear of cervix (LGSIL): Secondary | ICD-10-CM | POA: Insufficient documentation

## 2015-04-10 NOTE — Addendum Note (Signed)
Addended by: Quentin MullingOLLIER, Yeriel Mineo R on: 04/10/2015 04:08 PM   Modules accepted: Orders, SmartSet

## 2016-01-06 ENCOUNTER — Ambulatory Visit: Payer: Self-pay | Admitting: Internal Medicine

## 2016-01-13 ENCOUNTER — Encounter: Payer: Self-pay | Admitting: Internal Medicine

## 2016-01-13 ENCOUNTER — Ambulatory Visit (INDEPENDENT_AMBULATORY_CARE_PROVIDER_SITE_OTHER): Payer: BLUE CROSS/BLUE SHIELD | Admitting: Internal Medicine

## 2016-01-13 VITALS — BP 124/82 | HR 80 | Temp 97.5°F | Resp 16 | Ht 65.0 in | Wt 208.2 lb

## 2016-01-13 DIAGNOSIS — S93402A Sprain of unspecified ligament of left ankle, initial encounter: Secondary | ICD-10-CM

## 2016-01-13 MED ORDER — PREDNISONE 20 MG PO TABS: ORAL_TABLET | ORAL | Status: DC

## 2016-01-13 NOTE — Progress Notes (Signed)
  Subjective:    Patient ID: Karina Berry, female    DOB: 12-17-1975, 40 y.o.   MRN: [id number]  HPI  Patient denies discrete injury and reports initially had some discomfort in her Lt thigh, then later Lt leg & now seems localized to her Left ankle. Ambulation increases pain. Aleve helps the discomfort.  Medication Sig  . acyclovir (ZOVIRAX) 400 MG tablet Take 1 tablet (400 mg total) by mouth daily.   Allergies  Allergen Reactions  . Benadryl [Diphenhydramine] Rash   Past Medical History  Diagnosis Date  . Prediabetes   . Hyperlipemia   . Vitamin D deficiency   . Obesity   . Anemia   . GERD (gastroesophageal reflux disease)    Review of Systems  10 point systems review negative except as above.    Objective:   Physical Exam  BP 124/82 mmHg  Pulse 80  Temp(Src) 97.5 F (36.4 C)  Resp 16  Ht 5\' 5"  (1.651 m)  Wt 208 lb 3.2 oz (94.439 kg)  BMI 34.65 kg/m2  MS - Normal gait. (-) Homan's sn. Lt hip & knee with Nl ROM (+) tender Lt ankle over lateral malleolus & deltoid ligament accentuated with eversion.    Assessment & Plan:   1. Sprain of ankle, left, initial encounter  - Encouraged to purchase an OTC ankle support   - predniSONE  20 MG tablet; 1 tab 3 x day for 3 days, then 1 tab 2 x day for 3 days, then 1 tab 1 x day for 5 days  Disp: 20 tablet  - advise importance of diet (DASH) and exercise as recovery sx's allow   - Advised if sx's persist/worsen to see an Orthopedist in [place] where she now resides

## 2016-01-13 NOTE — Patient Instructions (Signed)

## 2016-03-31 ENCOUNTER — Other Ambulatory Visit: Payer: Self-pay

## 2016-03-31 MED ORDER — ACYCLOVIR 400 MG PO TABS: 400.0000 mg | ORAL_TABLET | Freq: Every day | ORAL | 12 refills | Status: DC

## 2016-04-02 ENCOUNTER — Encounter: Payer: Self-pay | Admitting: Physician Assistant

## 2016-04-16 ENCOUNTER — Encounter: Payer: Self-pay | Admitting: Physician Assistant

## 2016-06-05 ENCOUNTER — Encounter: Payer: Self-pay | Admitting: Physician Assistant
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
## 2016-08-17 ENCOUNTER — Encounter: Payer: Self-pay | Admitting: Physician Assistant

## 2016-08-17 ENCOUNTER — Other Ambulatory Visit (HOSPITAL_COMMUNITY)
Admission: RE | Admit: 2016-08-17 | Discharge: 2016-08-17 | Disposition: A | Discharge status: Home / Self Care | Payer: BLUE CROSS/BLUE SHIELD | Source: Ambulatory Visit | Attending: Physician Assistant | Admitting: Physician Assistant

## 2016-08-17 ENCOUNTER — Ambulatory Visit (INDEPENDENT_AMBULATORY_CARE_PROVIDER_SITE_OTHER): Payer: BLUE CROSS/BLUE SHIELD | Admitting: Physician Assistant

## 2016-08-17 VITALS — BP 126/90 | HR 78 | Temp 98.0°F | Resp 14 | Ht 65.5 in | Wt 210.0 lb

## 2016-08-17 DIAGNOSIS — Z1151 Encounter for screening for human papillomavirus (HPV): Secondary | ICD-10-CM | POA: Diagnosis present

## 2016-08-17 DIAGNOSIS — K219 Gastro-esophageal reflux disease without esophagitis: Secondary | ICD-10-CM

## 2016-08-17 DIAGNOSIS — N898 Other specified noninflammatory disorders of vagina: Secondary | ICD-10-CM | POA: Diagnosis not present

## 2016-08-17 DIAGNOSIS — Z113 Encounter for screening for infections with a predominantly sexual mode of transmission: Secondary | ICD-10-CM | POA: Insufficient documentation

## 2016-08-17 DIAGNOSIS — Z01411 Encounter for gynecological examination (general) (routine) with abnormal findings: Secondary | ICD-10-CM | POA: Diagnosis present

## 2016-08-17 DIAGNOSIS — B009 Herpesviral infection, unspecified: Secondary | ICD-10-CM | POA: Diagnosis not present

## 2016-08-17 DIAGNOSIS — Z79899 Other long term (current) drug therapy: Secondary | ICD-10-CM | POA: Diagnosis not present

## 2016-08-17 DIAGNOSIS — E559 Vitamin D deficiency, unspecified: Secondary | ICD-10-CM

## 2016-08-17 DIAGNOSIS — D509 Iron deficiency anemia, unspecified: Secondary | ICD-10-CM | POA: Diagnosis not present

## 2016-08-17 DIAGNOSIS — R87612 Low grade squamous intraepithelial lesion on cytologic smear of cervix (LGSIL): Secondary | ICD-10-CM | POA: Diagnosis not present

## 2016-08-17 DIAGNOSIS — Z0001 Encounter for general adult medical examination with abnormal findings: Secondary | ICD-10-CM | POA: Diagnosis not present

## 2016-08-17 DIAGNOSIS — Z1389 Encounter for screening for other disorder: Secondary | ICD-10-CM

## 2016-08-17 DIAGNOSIS — Z124 Encounter for screening for malignant neoplasm of cervix: Secondary | ICD-10-CM

## 2016-08-17 DIAGNOSIS — E785 Hyperlipidemia, unspecified: Secondary | ICD-10-CM

## 2016-08-17 DIAGNOSIS — R6889 Other general symptoms and signs: Secondary | ICD-10-CM

## 2016-08-17 DIAGNOSIS — R7303 Prediabetes: Secondary | ICD-10-CM

## 2016-08-17 LAB — HEMOGLOBIN A1C
Hgb A1c MFr Bld: 5.7 % — ABNORMAL HIGH (ref <5.7)
Mean Plasma Glucose: 117 mg/dL

## 2016-08-17 LAB — IRON AND TIBC
%SAT: 7 % — ABNORMAL LOW (ref 11–50)
IRON: 27 ug/dL — AB (ref 40–190)
TIBC: 379 ug/dL (ref 250–450)
UIBC: 352 ug/dL (ref 125–400)

## 2016-08-17 LAB — BASIC METABOLIC PANEL WITH GFR
BUN: 13 mg/dL (ref 7–25)
CALCIUM: 9.4 mg/dL (ref 8.6–10.2)
CO2: 23 mmol/L (ref 20–31)
Chloride: 106 mmol/L (ref 98–110)
Creat: 1.19 mg/dL — ABNORMAL HIGH (ref 0.50–1.10)
GFR, EST AFRICAN AMERICAN: 67 mL/min (ref >=60)
GFR, EST NON AFRICAN AMERICAN: 58 mL/min — AB (ref >=60)
GLUCOSE: 89 mg/dL (ref 65–99)
POTASSIUM: 4.3 mmol/L (ref 3.5–5.3)
Sodium: 139 mmol/L (ref 135–146)

## 2016-08-17 LAB — HEPATIC FUNCTION PANEL
ALBUMIN: 3.9 g/dL (ref 3.6–5.1)
ALK PHOS: 81 U/L (ref 33–115)
ALT: 10 U/L (ref 6–29)
AST: 14 U/L (ref 10–30)
BILIRUBIN INDIRECT: 0.3 mg/dL (ref 0.2–1.2)
BILIRUBIN TOTAL: 0.4 mg/dL (ref 0.2–1.2)
Bilirubin, Direct: 0.1 mg/dL (ref <=0.2)
TOTAL PROTEIN: 7.1 g/dL (ref 6.1–8.1)

## 2016-08-17 LAB — CBC WITH DIFFERENTIAL/PLATELET
BASOS PCT: 0 %
Basophils Absolute: 0 cells/uL (ref 0–200)
EOS PCT: 2 %
Eosinophils Absolute: 136 cells/uL (ref 15–500)
HEMATOCRIT: 34.2 % — AB (ref 35.0–45.0)
Hemoglobin: 10.7 g/dL — ABNORMAL LOW (ref 11.7–15.5)
LYMPHS PCT: 30 %
Lymphs Abs: 2040 cells/uL (ref 850–3900)
MCH: 23.1 pg — ABNORMAL LOW (ref 27.0–33.0)
MCHC: 31.3 g/dL — AB (ref 32.0–36.0)
MCV: 73.7 fL — ABNORMAL LOW (ref 80.0–100.0)
MONOS PCT: 7 %
MPV: 9.1 fL (ref 7.5–12.5)
Monocytes Absolute: 476 cells/uL (ref 200–950)
NEUTROS PCT: 61 %
Neutro Abs: 4148 cells/uL (ref 1500–7800)
PLATELETS: 418 10*3/uL — AB (ref 140–400)
RBC: 4.64 MIL/uL (ref 3.80–5.10)
RDW: 16.6 % — AB (ref 11.0–15.0)
WBC: 6.8 10*3/uL (ref 3.8–10.8)

## 2016-08-17 LAB — LIPID PANEL
CHOLESTEROL: 207 mg/dL — AB (ref <200)
HDL: 41 mg/dL — AB (ref >50)
LDL Cholesterol: 141 mg/dL — ABNORMAL HIGH (ref <100)
TRIGLYCERIDES: 124 mg/dL (ref <150)
Total CHOL/HDL Ratio: 5 Ratio — ABNORMAL HIGH (ref <5.0)
VLDL: 25 mg/dL (ref <30)

## 2016-08-17 LAB — FERRITIN: Ferritin: 14 ng/mL (ref 10–154)

## 2016-08-17 LAB — TSH: TSH: 1.21 mIU/L

## 2016-08-17 MED ORDER — METRONIDAZOLE 500 MG PO TABS: 500.0000 mg | ORAL_TABLET | Freq: Two times a day (BID) | ORAL | 0 refills | Status: AC

## 2016-08-17 NOTE — Patient Instructions (Addendum)
Vitamin D goal is between 60-80  Please make sure that you are taking your Vitamin D as directed.   It is very important as a natural anti-inflammatory   helping hair, skin, and nails, as well as reducing stroke and heart attack risk.   It helps your bones and helps with mood.  We want you on at least 5000 IU daily  It also decreases numerous cancer risks so please take it as directed.   Low Vit D is associated with a 200-300% higher risk for CANCER   and 200-300% higher risk for HEART   ATTACK  &  STROKE.    ......................................  It is also associated with higher death rate at younger ages,   autoimmune diseases likMarland [place] Rheumatoid arthritis, Lupus, Multiple Sclerosis.     Also many other serious conditions, like depression, Alzheimer's  Dementia, infertility, muscle aches, fatigue, fibromyalgia - just to name a few.  +++++++++++++++++++  Get on 10,000 IU a day Can get liquid vitamin D from [employer]  OR here in [place] at  [facility] [address], [place], [place] [id number] Or you can try earth fare   Iron Deficiency Anemia, Adult Iron-deficiency anemia is when you have a low amount of red blood cells or hemoglobin. This happens because you have too little iron in your body. Hemoglobin carries oxygen to parts of the body. Anemia can cause your body to not get enough oxygen. It may or may not cause symptoms. Follow these instructions at home: Medicines  Take over-the-counter and prescription medicines only as told by your doctor. This includes iron pills (supplements) and vitamins.  If you cannot handle taking iron pills by mouth, ask your doctor about getting iron through:  A vein (intravenously).  A shot (injection) into a muscle.  Take iron pills when your stomach is empty. If you cannot handle this, take them with food.  Do not drink milk or take antacids at the same time as your iron pills.  To prevent trouble pooping (constipation),  eat fiber or take medicine (stool softener) as told by your doctor. Eating and drinking  Talk with your doctor before changing the foods you eat. He or she may tell you to eat foods that have a lot of iron, such as:  Liver.  Lowfat (lean) beef.  Breads and cereals that have iron added to them (fortified breads and cereals).  Eggs.  Dried fruit.  Dark green, leafy vegetables.  Drink enough fluid to keep your pee (urine) clear or pale yellow.  Eat fresh fruits and vegetables that are high in vitamin C. They help your body to use iron. Foods with a lot of vitamin C include:  Oranges.  Peppers.  Tomatoes.  Mangoes. General instructions  Return to your normal activities as told by your doctor. Ask your doctor what activities are safe for you.  Keep yourself clean, and keep things clean around you (your surroundings). Anemia can make you get sick more easily.  Keep all follow-up visits as told by your doctor. This is important. Contact a doctor if:  You feel sick to your stomach (nauseous).  You throw up (vomit).  You feel weak.  You are sweating for no clear reason.  You have trouble pooping, such as:  Pooping (having a bowel movement) less than 3 times a week.  Straining to poop.  Having poop that is hard, dry, or larger than normal.  Feeling full or bloated.  Pain in the lower belly.  Not feeling better  after pooping. Get help right away if:  You pass out (faint). If this happens, do not drive yourself to the hospital. Call your local emergency services (911 in the [place]  You have chest pain.  You have shortness of breath that:  Is very bad.  Gets worse with physical activity.  You have a fast heartbeat.  You get light-headed when getting up from sitting or lying down. This information is not intended to replace advice given to you by your health care provider. Make sure you discuss any questions you have with your health care provider. Document  Released: 07/18/2010 Document Revised: 03/04/2016 Document Reviewed: 03/04/2016 Elsevier Interactive Patient Education  2017 Elsevier Inc.    Bacterial Vaginosis Bacterial vaginosis is a vaginal infection that occurs when the normal balance of bacteria in the vagina is disrupted. It results from an overgrowth of certain bacteria. This is the most common vaginal infection among women ages 35-44. Because bacterial vaginosis increases your risk for STIs (sexually transmitted infections), getting treated can help reduce your risk for chlamydia, gonorrhea, herpes, and HIV (human immunodeficiency virus). Treatment is also important for preventing complications in pregnant women, because this condition can cause an early (premature) delivery. What are the causes? This condition is caused by an increase in harmful bacteria that are normally present in small amounts in the vagina. However, the reason that the condition develops is not fully understood. What increases the risk? The following factors may make you more likely to develop this condition:  Having unprotected sex.  Douching.  Having an intrauterine device (IUD).  Smoking.  Drug and alcohol abuse.  Taking certain antibiotic medicines.  Being pregnant. You cannot get bacterial vaginosis from toilet seats, bedding, swimming pools, or contact with objects around you. What are the signs or symptoms? Symptoms of this condition include:  Grey or white vaginal discharge. The discharge can also be watery or foamy.  A fish-like odor with discharge, especially after sexual intercourse or during menstruation.  Itching in and around the vagina.  Burning or pain with urination. Some women with bacterial vaginosis have no signs or symptoms. How is this diagnosed? This condition is diagnosed based on:  Your medical history.  A physical exam of the vagina.  Testing a sample of vaginal fluid under a microscope to look for a large amount  of bad bacteria or abnormal cells. Your health care provider may use a cotton swab or a small wooden spatula to collect the sample. How is this treated? This condition is treated with antibiotics. These may be given as a pill, a vaginal cream, or a medicine that is put into the vagina (suppository). If the condition comes back after treatment, a second round of antibiotics may be needed. Follow these instructions at home: Medicines  Take over-the-counter and prescription medicines only as told by your health care provider.  Take or use your antibiotic as told by your health care provider. Do not stop taking or using the antibiotic even if you start to feel better. General instructions  If you have a female sexual partner, tell her that you have a vaginal infection. She should see her health care provider and be treated if she has symptoms. If you have a female sexual partner, he does not need treatment.  During treatment:  Avoid sexual activity until you finish treatment.  Do not douche.  Avoid alcohol as directed by your health care provider.  Avoid breastfeeding as directed by your health care provider.  Drink enough  water and fluids to keep your urine clear or pale yellow.  Keep the area around your vagina and rectum clean.  Wash the area daily with warm water.  Wipe yourself from front to back after using the toilet.  Keep all follow-up visits as told by your health care provider. This is important. How is this prevented?  Do not douche.  Wash the outside of your vagina with warm water only.  Use protection when having sex. This includes latex condoms and dental dams.  Limit how many sexual partners you have. To help prevent bacterial vaginosis, it is best to have sex with just one partner (monogamous).  Make sure you and your sexual partner are tested for STIs.  Wear cotton or cotton-lined underwear.  Avoid wearing tight pants and pantyhose, especially during  summer.  Limit the amount of alcohol that you drink.  Do not use any products that contain nicotine or tobacco, such as cigarettes and e-cigarettes. If you need help quitting, ask your health care provider.  Do not use illegal drugs. Where to find more information:  Centers for Disease Control and Prevention: SolutionApps.co.za  American Sexual Health Association (ASHA): www.ashastd.org  U.S. Department of Health and Health and safety inspector, Office on Women's Health: ConventionalMedicines.si or http://www.anderson-williamson.info/ Contact a health care provider if:  Your symptoms do not improve, even after treatment.  You have more discharge or pain when urinating.  You have a fever.  You have pain in your abdomen.  You have pain during sex.  You have vaginal bleeding between periods. Summary  Bacterial vaginosis is a vaginal infection that occurs when the normal balance of bacteria in the vagina is disrupted.  Because bacterial vaginosis increases your risk for STIs (sexually transmitted infections), getting treated can help reduce your risk for chlamydia, gonorrhea, herpes, and HIV (human immunodeficiency virus). Treatment is also important for preventing complications in pregnant women, because the condition can cause an early (premature) delivery.  This condition is treated with antibiotic medicines. These may be given as a pill, a vaginal cream, or a medicine that is put into the vagina (suppository). This information is not intended to replace advice given to you by your health care provider. Make sure you discuss any questions you have with your health care provider. Document Released: 06/15/2005 Document Revised: 02/29/2016 Document Reviewed: 02/29/2016 Elsevier Interactive Patient Education  2017 [employer].

## 2016-08-17 NOTE — Progress Notes (Signed)
Complete Physical  Assessment and Plan: Iron deficiency anemia, unspecified iron deficiency anemia type -     Iron and TIBC -     Ferritin  Hyperlipidemia, unspecified hyperlipidemia type -continue medications, check lipids, decrease fatty foods, increase activity.  -     TSH -     Lipid panel  Medication management -     CBC with Differential/Platelet -     BASIC METABOLIC PANEL WITH GFR -     Hepatic function panel -     Magnesium  Vitamin D deficiency -     VITAMIN D 25 Hydroxy (Vit-D Deficiency, Fractures)  Low grade squamous intraepithelial lesion (LGSIL) on Papanicolaou smear of cervix -     Cytology - PAP - will repeat, will refer for GYN in [place]  Prediabetes -     Hemoglobin A1c  Gastroesophageal reflux disease, esophagitis presence not specified Continue PPI/H2 blocker, diet discussed  HSV-1 infection Continue acyclovir  Encounter for general adult medical examination with abnormal findings  Screening for cervical cancer -     Cytology - PAP  Vaginal odor Patient does douch, told not to do this any more Likely BV from symptoms, check wet prep -     Urine culture -     Cytology - PAP  Screening for hematuria or proteinuria -     Urinalysis, Routine w reflex microscopic -     Microalbumin / creatinine urine ratio  Morbid Obesity with co morbidities - long discussion about weight loss, diet, and exercise  Discussed med's effects and SE's. Screening labs and tests as requested with regular follow-up as recommended. Over 40 minutes of exam, counseling, chart review, and complex, high level critical decision making was performed this visit.   HPI  41 y.o. female  presents for a complete physical and follow up for has Prediabetes; Obesity, unspecified; Iron deficiency anemia; Hyperlipidemia; Medication management; Vitamin D deficiency; HSV-1 infection; GERD (gastroesophageal reflux disease); and Low grade squamous intraepithelial lesion (LGSIL) on  Papanicolaou smear of cervix on her problem list..  Her blood pressure has been controlled at home, today their BP is BP: 126/90 She does not workout. She denies chest pain, shortness of breath, dizziness.   Lives in [place] and wants referral for GYN in [place].  She had abnormal PAP smear last year 2016 states at that time it was normal, does have an out break right now and is on her menses. Denies any discharge but does have an abnormal odor. No new sexual partners, no chance of being pregnant.  She is not on cholesterol medication and denies myalgias. Her cholesterol is not at goal. The cholesterol last visit was:   Lab Results  Component Value Date   CHOL 203 (H) 04/01/2015   HDL 40 (L) 04/01/2015   LDLCALC 133 (H) 04/01/2015   TRIG 148 04/01/2015   CHOLHDL 5.1 (H) 04/01/2015   She has been working on diet and exercise for prediabetes,   and denies paresthesia of the feet, polydipsia, polyuria and visual disturbances. Last A1C in the office was:  Lab Results  Component Value Date   HGBA1C 6.0 (H) 04/01/2015   Patient is on Vitamin D supplement.   Lab Results  Component Value Date   VD25OH 9 (L) 04/01/2015     BMI is Body mass index is 34.41 kg/m., she is working on diet and exercise. Wt Readings from Last 3 Encounters:  08/17/16 210 lb (95.3 kg)  01/13/16 208 lb 3.2 oz (94.4 kg)  04/01/15 192 lb (87.1 kg)   She has iron def anemia Lab Results  Component Value Date   IRON 30 (L) 04/01/2015   TIBC 433 04/01/2015   FERRITIN 11 04/01/2015    Current Medications:  Current Outpatient Prescriptions on File Prior to Visit  Medication Sig Dispense Refill  . acyclovir (ZOVIRAX) 400 MG tablet Take 1 tablet (400 mg total) by mouth daily. 30 tablet 12   No current facility-administered medications on file prior to visit.    Allergies:  Allergies  Allergen Reactions  . Benadryl [Diphenhydramine] Rash   Medical History:  She has Prediabetes; Obesity, unspecified; Iron  deficiency anemia; Hyperlipidemia; Medication management; Vitamin D deficiency; HSV-1 infection; GERD (gastroesophageal reflux disease); and Low grade squamous intraepithelial lesion (LGSIL) on Papanicolaou smear of cervix on her problem list.   Health Maintenance:   Immunization History  Administered Date(s) Administered  . Tdap 04/01/2015   Tetanus: 2016 Pneumovax: 1997 Prevnar 13: N/A Flu vaccine: N/A Zostavax: N/A LMP: N/A  Pap: 2016 MGM: Due age 23 DEXA: N/A Colonoscopy: N/A EGD: N/A  Patient Care Team: Lucky Cowboy, MD as PCP - General (Internal Medicine)  Surgical History:  She has no past surgical history on file. Family History:  Herfamily history includes Asthma in her mother; Cancer in her father; Hypertension in her father and mother; Stroke in her paternal grandfather. Social History:  She reports that she has never smoked. She has never used smokeless tobacco. She reports that she does not drink alcohol or use drugs.  Review of Systems: Review of Systems  Constitutional: Negative.   HENT: Negative.   Eyes: Negative.   Respiratory: Negative.   Cardiovascular: Negative.   Gastrointestinal: Negative.   Genitourinary: Positive for frequency.       Vaginal discharge, ordor  Musculoskeletal: Negative.   Skin: Negative.     Physical Exam: Estimated body mass index is 34.41 kg/m as calculated from the following:   Height as of this encounter: 5' 5.5" (1.664 m).   Weight as of this encounter: 210 lb (95.3 kg). BP 126/90   Pulse 78   Temp 98 F (36.7 C)   Resp 14   Ht 5' 5.5" (1.664 m)   Wt 210 lb (95.3 kg)   LMP 08/14/2016   SpO2 98%   BMI 34.41 kg/m  General Appearance: Well nourished, in no apparent distress.  Eyes: PERRLA, EOMs, conjunctiva no swelling or erythema, normal fundi and vessels.  Sinuses: No Frontal/maxillary tenderness  ENT/Mouth: Ext aud canals clear, normal light reflex with TMs without erythema, bulging. Good dentition. No  erythema, swelling, or exudate on post pharynx. Tonsils not swollen or erythematous. Hearing normal.  Neck: Supple, thyroid normal. No bruits  Respiratory: Respiratory effort normal, BS equal bilaterally without rales, rhonchi, wheezing or stridor.  Cardio: RRR without murmurs, rubs or gallops. Brisk peripheral pulses without edema.  Chest: symmetric, with normal excursions and percussion.  Breasts: Symmetric, without lumps, nipple discharge, retractions.  Abdomen: Soft, nontender, no guarding, rebound, hernias, masses, or organomegaly.  Lymphatics: Non tender without lymphadenopathy.  Genitourinary: VULVA: normal appearing vulva with no masses, tenderness or lesions, VAGINA: vaginal discharge - clear, copious and malodorous, CERVIX: normal appearing cervix without discharge or lesions, cervical discharge present - clear and bloody. Musculoskeletal: Full ROM all peripheral extremities,5/5 strength, and normal gait.  Skin: Warm, dry without rashes, lesions, ecchymosis. Neuro: Cranial nerves intact, reflexes equal bilaterally. Normal muscle tone, no cerebellar symptoms. Sensation intact.  Psych: Awake and oriented X 3, normal affect,  Insight and Judgment appropriate.   EKG: defer AORTA SCAN: defer   Quentin MullingAmanda Eliberto Sole 2:13 PM [facility]

## 2016-08-18 LAB — MICROALBUMIN / CREATININE URINE RATIO
CREATININE, URINE: 100 mg/dL (ref 20–320)
MICROALB UR: 0.4 mg/dL
MICROALB/CREAT RATIO: 4 ug/mg{creat} (ref <30)

## 2016-08-18 LAB — URINALYSIS, ROUTINE W REFLEX MICROSCOPIC
Bilirubin Urine: NEGATIVE
Glucose, UA: NEGATIVE
Ketones, ur: NEGATIVE
LEUKOCYTES UA: NEGATIVE
NITRITE: NEGATIVE
PROTEIN: NEGATIVE
Specific Gravity, Urine: 1.013 (ref 1.001–1.035)
pH: 7 (ref 5.0–8.0)

## 2016-08-18 LAB — VITAMIN D 25 HYDROXY (VIT D DEFICIENCY, FRACTURES): VIT D 25 HYDROXY: 11 ng/mL — AB (ref 30–100)

## 2016-08-18 LAB — URINALYSIS, MICROSCOPIC ONLY
Bacteria, UA: NONE SEEN [HPF]
Casts: NONE SEEN [LPF]
Crystals: NONE SEEN [HPF]
Squamous Epithelial / LPF: NONE SEEN [HPF] (ref <=5)
WBC, UA: NONE SEEN WBC/HPF (ref <=5)
Yeast: NONE SEEN [HPF]

## 2016-08-18 LAB — MAGNESIUM: MAGNESIUM: 1.8 mg/dL (ref 1.5–2.5)

## 2016-08-19 ENCOUNTER — Other Ambulatory Visit: Payer: Self-pay | Admitting: Physician Assistant

## 2016-08-19 LAB — URINE CULTURE

## 2016-08-19 MED ORDER — AMOXICILLIN-POT CLAVULANATE 875-125 MG PO TABS: 1.0000 | ORAL_TABLET | Freq: Two times a day (BID) | ORAL | 0 refills | Status: DC

## 2016-08-19 NOTE — Progress Notes (Signed)
Pt aware of lab results & voiced understanding of those results.

## 2016-08-24 ENCOUNTER — Other Ambulatory Visit: Payer: Self-pay | Admitting: Physician Assistant

## 2016-08-24 LAB — CYTOLOGY - PAP
Bacterial vaginitis: POSITIVE — AB
CANDIDA VAGINITIS: NEGATIVE
Chlamydia: NEGATIVE
HPV (WINDOPATH): NOT DETECTED
Neisseria Gonorrhea: NEGATIVE

## 2016-09-11 ENCOUNTER — Other Ambulatory Visit: Payer: Self-pay

## 2016-09-11 ENCOUNTER — Ambulatory Visit: Payer: Self-pay | Admitting: Physician Assistant

## 2017-04-08 ENCOUNTER — Encounter: Payer: Self-pay | Admitting: Physician Assistant

## 2017-06-02 ENCOUNTER — Other Ambulatory Visit: Payer: Self-pay | Admitting: Physician Assistant

## 2017-06-02 MED ORDER — ACYCLOVIR 400 MG PO TABS: ORAL_TABLET | ORAL | 0 refills | Status: DC

## 2017-06-02 NOTE — Progress Notes (Signed)
Future Appointments  Date Time Provider Department Center  08/17/2017  2:00 PM Quentin Mullingollier, Amanda, PA-C [id number] None

## 2017-06-09 ENCOUNTER — Encounter: Payer: Self-pay | Admitting: Physician Assistant

## 2017-07-12 DIAGNOSIS — Z23 Encounter for immunization: Secondary | ICD-10-CM | POA: Diagnosis not present

## 2017-07-12 DIAGNOSIS — Z6832 Body mass index (BMI) 32.0-32.9, adult: Secondary | ICD-10-CM | POA: Diagnosis not present

## 2017-07-12 DIAGNOSIS — Z131 Encounter for screening for diabetes mellitus: Secondary | ICD-10-CM | POA: Diagnosis not present

## 2017-07-12 DIAGNOSIS — Z713 Dietary counseling and surveillance: Secondary | ICD-10-CM | POA: Diagnosis not present

## 2017-07-12 DIAGNOSIS — Z13220 Encounter for screening for lipoid disorders: Secondary | ICD-10-CM | POA: Diagnosis not present

## 2017-07-12 DIAGNOSIS — Z136 Encounter for screening for cardiovascular disorders: Secondary | ICD-10-CM | POA: Diagnosis not present

## 2017-08-16 NOTE — Progress Notes (Signed)
Complete Physical  Assessment and Plan: Iron deficiency anemia, unspecified iron deficiency anemia type -     Iron and TIBC  Hyperlipidemia, unspecified hyperlipidemia type -continue medications, check lipids, decrease fatty foods, increase activity.  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 Screening labs and tests as requested with regular follow-up as recommended. Over 40 minutes of exam, counseling, chart review, and complex, high level critical decision making was performed this visit.   HPI  42 y.o. female  presents for a complete physical and follow up for has Prediabetes; Obesity, unspecified; Iron deficiency anemia; Hyperlipidemia; Medication management; Vitamin D deficiency; HSV-1 infection; GERD (gastroesophageal reflux disease); and Low grade squamous intraepithelial lesion (LGSIL) on Papanicolaou smear of cervix on their problem list..  Her blood pressure has been controlled at home, today their BP is BP: 128/66 She does not workout. She denies chest pain, shortness of breath, dizziness.   [place] referral for GYN in [place].  She had normal PAP, negative HPV 08/17/2016, will repeat 3 years.  No new sexual partners, no chance of being pregnant.  She is not on cholesterol medication  and denies myalgias. Her cholesterol is not at goal. The cholesterol last visit was:   Lab Results  Component Value Date   CHOL 207 (H) 08/17/2016   HDL 41 (L) 08/17/2016   LDLCALC 141 (H) 08/17/2016   TRIG 124 08/17/2016   CHOLHDL 5.0 (H) 08/17/2016   She has been working on diet and exercise for prediabetes,   and denies paresthesia of the feet, polydipsia, polyuria and visual disturbances. Last A1C in the office was:  Lab Results  Component Value Date   HGBA1C 5.7 (H) 08/17/2016   Patient is on Vitamin D supplement, 10,000 IU a day.   Lab Results  Component Value Date   VD25OH 11 (L) 08/17/2016     BMI is Body mass index is 32.38 kg/m., she is working on diet and exercise. Wt Readings from Last 3 Encounters:  08/17/17 197 lb 9.6 oz (89.6 kg)  08/17/16 210 lb (95.3 kg)  01/13/16 208 lb 3.2 oz (94.4 kg)   She has iron def anemia Lab Results  Component Value Date   IRON 27 (L) 08/17/2016   TIBC 379 08/17/2016   FERRITIN 14 08/17/2016    Current Medications:  Current Outpatient Medications on File Prior to Visit  Medication Sig Dispense Refill  . acyclovir (ZOVIRAX) 400 MG tablet TAKE 1 TABLET(400 MG) BY MOUTH DAILY 90 tablet 0   No current facility-administered medications on file prior to visit.    Allergies:  Allergies  Allergen Reactions  . Benadryl [Diphenhydramine] Rash   Medical History:  She has Prediabetes; Obesity, unspecified; Iron deficiency anemia; Hyperlipidemia; Medication management;  Vitamin D deficiency; HSV-1 infection; GERD (gastroesophageal reflux disease); and Low grade squamous intraepithelial lesion (LGSIL) on Papanicolaou smear of cervix on their problem list.   Health Maintenance:   Immunization History  Administered Date(s) Administered  . Tdap 04/01/2015   Tetanus: 2016 Pneumovax: 1997 Prevnar 13: N/A Flu vaccine: N/A Zostavax: N/A LMP: N/A  Pap: 2018 negative and neg HPV MGM: Due age 24 DEXA: N/A Colonoscopy: N/A EGD:  N/A  Patient Care Team: Lucky Cowboy, MD as PCP - General (Internal Medicine)  Surgical History:  She has no past surgical history on file. Family History:  Herfamily history includes Asthma in her mother; Cancer in her father; Hypertension in her father and mother; Stroke in her paternal grandfather. Social History:  She reports that  has never smoked. she has never used smokeless tobacco. She reports that she does not drink alcohol or use drugs.  Review of Systems: Review of Systems  Constitutional: Negative.   HENT: Negative.   Eyes: Negative.   Respiratory: Negative.   Cardiovascular: Negative.   Gastrointestinal: Negative.   Genitourinary: Positive for frequency.       Vaginal discharge, ordor  Musculoskeletal: Negative.   Skin: Negative.     Physical Exam: Estimated body mass index is 32.38 kg/m as calculated from the following:   Height as of this encounter: 5' 5.5" (1.664 m).   Weight as of this encounter: 197 lb 9.6 oz (89.6 kg). BP 128/66   Pulse (!) 103   Temp 97.8 F (36.6 C)   Resp 16   Ht 5' 5.5" (1.664 m)   Wt 197 lb 9.6 oz (89.6 kg)   LMP 08/06/2017   SpO2 98%   BMI 32.38 kg/m  General Appearance: Well nourished, in no apparent distress.  Eyes: PERRLA, EOMs, conjunctiva no swelling or erythema, normal fundi and vessels.  Sinuses: No Frontal/maxillary tenderness  ENT/Mouth: Ext aud canals clear, normal light reflex with TMs without erythema, bulging. Good dentition. No erythema, swelling, or exudate on post pharynx. Tonsils not swollen or erythematous. Hearing normal.  Neck: Supple, thyroid normal. No bruits  Respiratory: Respiratory effort normal, BS equal bilaterally without rales, rhonchi, wheezing or stridor.  Cardio: RRR without murmurs, rubs or gallops. Brisk peripheral pulses without edema.  Chest: symmetric, with normal excursions and percussion.  Breasts: Symmetric, without lumps, nipple discharge, retractions.  Abdomen: Soft, nontender,  no guarding, rebound, hernias, masses, or organomegaly.  Lymphatics: Non tender without lymphadenopathy.  Genitourinary: VULVA: normal appearing vulva with no masses, tenderness or lesions, VAGINA: vaginal discharge - clear, copious and malodorous, CERVIX: normal appearing cervix without discharge or lesions, cervical discharge present - clear and bloody. Musculoskeletal: Full ROM all peripheral extremities,5/5 strength, and normal gait.  Skin: Warm, dry without rashes, lesions, ecchymosis. Neuro: Cranial nerves intact, reflexes equal bilaterally. Normal muscle tone, no cerebellar symptoms. Sensation intact.  Psych: Awake and oriented X 3, normal affect, Insight and Judgment appropriate.   EKG: defer AORTA SCAN: defer   Quentin Mulling 2:12 PM [facility]

## 2017-08-17 ENCOUNTER — Ambulatory Visit (INDEPENDENT_AMBULATORY_CARE_PROVIDER_SITE_OTHER): Payer: BLUE CROSS/BLUE SHIELD | Admitting: Physician Assistant

## 2017-08-17 ENCOUNTER — Encounter: Payer: Self-pay | Admitting: Physician Assistant

## 2017-08-17 VITALS — BP 128/66 | HR 103 | Temp 97.8°F | Resp 16 | Ht 65.5 in | Wt 197.6 lb

## 2017-08-17 DIAGNOSIS — Z0000 Encounter for general adult medical examination without abnormal findings: Secondary | ICD-10-CM | POA: Diagnosis not present

## 2017-08-17 DIAGNOSIS — E785 Hyperlipidemia, unspecified: Secondary | ICD-10-CM

## 2017-08-17 DIAGNOSIS — R7303 Prediabetes: Secondary | ICD-10-CM

## 2017-08-17 DIAGNOSIS — I10 Essential (primary) hypertension: Secondary | ICD-10-CM

## 2017-08-17 DIAGNOSIS — Z6832 Body mass index (BMI) 32.0-32.9, adult: Secondary | ICD-10-CM

## 2017-08-17 DIAGNOSIS — D509 Iron deficiency anemia, unspecified: Secondary | ICD-10-CM | POA: Diagnosis not present

## 2017-08-17 DIAGNOSIS — K219 Gastro-esophageal reflux disease without esophagitis: Secondary | ICD-10-CM

## 2017-08-17 DIAGNOSIS — E559 Vitamin D deficiency, unspecified: Secondary | ICD-10-CM

## 2017-08-17 DIAGNOSIS — R87612 Low grade squamous intraepithelial lesion on cytologic smear of cervix (LGSIL): Secondary | ICD-10-CM

## 2017-08-17 DIAGNOSIS — B009 Herpesviral infection, unspecified: Secondary | ICD-10-CM

## 2017-08-17 DIAGNOSIS — Z79899 Other long term (current) drug therapy: Secondary | ICD-10-CM

## 2017-08-17 DIAGNOSIS — Z0001 Encounter for general adult medical examination with abnormal findings: Secondary | ICD-10-CM

## 2017-08-17 NOTE — Patient Instructions (Addendum)
Benefiber or Citracel is good for constipation/diarrhea/irritable bowel syndrome, it helps with weight loss and can help lower your bad cholesterol. Please do 1 TBSP in the morning in water, coffee, or tea. It can take up to a month before you can see a difference with your bowel movements. It is cheapest from [employer], [employer], [employer].    Vitamin D goal is between 60-80  Please make sure that you are taking your Vitamin D as directed.   It is very important as a natural anti-inflammatory   helping hair, skin, and nails, as well as reducing stroke and heart attack risk.   It helps your bones and helps with mood.  We want you on at least 10000 IU daily  It also decreases numerous cancer risks so please take it as directed.   Low Vit D is associated with a 200-300% higher risk for CANCER   and 200-300% higher risk for HEART   ATTACK  &  STROKE.    .....................................Marland Kitchen.  It is also associated with higher death rate at younger ages,   autoimmune diseases like Rheumatoid arthritis, Lupus, Multiple Sclerosis.     Also many other serious conditions, like depression, Alzheimer's  Dementia, infertility, muscle aches, fatigue, fibromyalgia - just to name a few.  +++++++++++++++++++  Can get liquid vitamin D from [place]  OR [place] at  [facility] [address], [place], [place] [id number] Or you can try earth fare   Add 500mg  vitamin C with the iron and can add probiotic to help decrease constipation.   Being dehydrated can hurt your kidneys, cause fatigue, headaches, muscle aches, joint pain, and dry skin/nails so please increase your fluids.   Drink 80-100 oz a day of water, measure it out! Eat 3 meals a day, have to do breakfast, eat protein- hard boiled eggs, protein bar like nature valley protein bar, greek yogurt like oikos triple zero, chobani 100, or light n fit greek  Can check out plantnanny app on your phone to help you keep track of your  water  Due for for mammogram after august Can call and just get  [facility]  7 a.m.-6:30 p.m., Monday 7 a.m.-5 p.m., Tuesday-Friday Schedule an appointment by calling [phone].  11 Tips to Follow:  1. No caffeine after 3pm: Avoid beverages with caffeine (soda, tea, energy drinks, etc.) especially after 3pm. 2. Don't go to bed hungry: Have your evening meal at least 3 hrs. before going to sleep. It's fine to have a small bedtime snack such as a glass of milk and a few crackers but don't have a big meal. 3. Have a nightly routine before bed: Plan on "winding down" before you go to sleep. Begin relaxing about 1 hour before you go to bed. Try doing a quiet activity such as listening to calming music, reading a book or meditating. 4. Turn off the TV and ALL electronics including video games, tablets, laptops, etc. 1 hour before sleep, and keep them out of the bedroom. 5. Turn off your cell phone and all notifications (new email and text alerts) or even better, leave your phone outside your room while you sleep. Studies have shown that a part of your brain continues to respond to certain lights and sounds even while you're still asleep. 6. Make your bedroom quiet, dark and cool. If you can't control the noise, try wearing earplugs or using a fan to block out other sounds. 7. Practice relaxation techniques. Try reading  a book or meditating or drain your brain by writing a list of what you need to do the next day. 8. Don't nap unless you feel sick: you'll have a better night's sleep. 9. Don't smoke, or quit if you do. Nicotine, alcohol, and marijuana can all keep you awake. Talk to your health care provider if you need help with substance use. 10. Most importantly, wake up at the same time every day (or within 1 hour of your usual wake up time) EVEN on the weekends. A regular wake up time promotes sleep hygiene and prevents sleep problems. 11. Reduce exposure to bright  light in the last three hours of the day before going to sleep. Maintaining good sleep hygiene and having good sleep habits lower your risk of developing sleep problems. Getting better sleep can also improve your concentration and alertness. Try the simple steps in this guide. If you still have trouble getting enough rest, make an appointment with your health care provider.

## 2017-08-18 LAB — BASIC METABOLIC PANEL WITH GFR
BUN: 8 mg/dL (ref 7–25)
CALCIUM: 9.9 mg/dL (ref 8.6–10.2)
CO2: 28 mmol/L (ref 20–32)
Chloride: 103 mmol/L (ref 98–110)
Creat: 0.98 mg/dL (ref 0.50–1.10)
GFR, EST AFRICAN AMERICAN: 84 mL/min/{1.73_m2} (ref >=60)
GFR, EST NON AFRICAN AMERICAN: 73 mL/min/{1.73_m2} (ref >=60)
Glucose, Bld: 85 mg/dL (ref 65–99)
Potassium: 4.7 mmol/L (ref 3.5–5.3)
Sodium: 138 mmol/L (ref 135–146)

## 2017-08-18 LAB — CBC WITH DIFFERENTIAL/PLATELET
BASOS PCT: 0.9 %
Basophils Absolute: 51 cells/uL (ref 0–200)
Eosinophils Absolute: 131 cells/uL (ref 15–500)
Eosinophils Relative: 2.3 %
HEMATOCRIT: 35.8 % (ref 35.0–45.0)
Hemoglobin: 11.4 g/dL — ABNORMAL LOW (ref 11.7–15.5)
LYMPHS ABS: 1835 {cells}/uL (ref 850–3900)
MCH: 23.8 pg — ABNORMAL LOW (ref 27.0–33.0)
MCHC: 31.8 g/dL — ABNORMAL LOW (ref 32.0–36.0)
MCV: 74.9 fL — AB (ref 80.0–100.0)
MPV: 11.1 fL (ref 7.5–12.5)
Monocytes Relative: 9.1 %
NEUTROS PCT: 55.5 %
Neutro Abs: 3164 cells/uL (ref 1500–7800)
Platelets: 210 10*3/uL (ref 140–400)
RBC: 4.78 10*6/uL (ref 3.80–5.10)
RDW: 14.8 % (ref 11.0–15.0)
Total Lymphocyte: 32.2 %
WBC: 5.7 10*3/uL (ref 3.8–10.8)
WBCMIX: 519 {cells}/uL (ref 200–950)

## 2017-08-18 LAB — HEPATIC FUNCTION PANEL
AG Ratio: 1.2 (calc) (ref 1.0–2.5)
ALBUMIN MSPROF: 4.2 g/dL (ref 3.6–5.1)
ALT: 10 U/L (ref 6–29)
AST: 17 U/L (ref 10–30)
Alkaline phosphatase (APISO): 94 U/L (ref 33–115)
BILIRUBIN DIRECT: 0.1 mg/dL (ref 0.0–0.2)
BILIRUBIN TOTAL: 0.4 mg/dL (ref 0.2–1.2)
GLOBULIN: 3.5 g/dL (ref 1.9–3.7)
Indirect Bilirubin: 0.3 mg/dL (calc) (ref 0.2–1.2)
Total Protein: 7.7 g/dL (ref 6.1–8.1)

## 2017-08-18 LAB — IRON, TOTAL/TOTAL IRON BINDING CAP
%SAT: 8 % (calc) — ABNORMAL LOW (ref 11–50)
Iron: 32 ug/dL — ABNORMAL LOW (ref 40–190)
TIBC: 384 ug/dL (ref 250–450)

## 2017-08-18 LAB — LIPID PANEL
CHOL/HDL RATIO: 5.1 (calc) — AB (ref <5.0)
Cholesterol: 221 mg/dL — ABNORMAL HIGH (ref <200)
HDL: 43 mg/dL — AB (ref >50)
LDL CHOLESTEROL (CALC): 147 mg/dL — AB
NON-HDL CHOLESTEROL (CALC): 178 mg/dL — AB (ref <130)
Triglycerides: 169 mg/dL — ABNORMAL HIGH (ref <150)

## 2017-08-18 LAB — TSH: TSH: 1.05 mIU/L

## 2017-08-18 LAB — VITAMIN D 25 HYDROXY (VIT D DEFICIENCY, FRACTURES): Vit D, 25-Hydroxy: 31 ng/mL (ref 30–100)

## 2017-09-07 ENCOUNTER — Other Ambulatory Visit: Payer: Self-pay

## 2017-09-07 MED ORDER — ACYCLOVIR 400 MG PO TABS: ORAL_TABLET | ORAL | 2 refills | Status: DC

## 2018-01-14 DIAGNOSIS — R87619 Unspecified abnormal cytological findings in specimens from cervix uteri: Secondary | ICD-10-CM | POA: Diagnosis not present

## 2018-01-14 DIAGNOSIS — R87612 Low grade squamous intraepithelial lesion on cytologic smear of cervix (LGSIL): Secondary | ICD-10-CM | POA: Diagnosis not present

## 2018-05-06 DIAGNOSIS — Z87410 Personal history of cervical dysplasia: Secondary | ICD-10-CM | POA: Diagnosis not present

## 2018-05-06 DIAGNOSIS — R87612 Low grade squamous intraepithelial lesion on cytologic smear of cervix (LGSIL): Secondary | ICD-10-CM | POA: Diagnosis not present

## 2018-05-06 DIAGNOSIS — N870 Mild cervical dysplasia: Secondary | ICD-10-CM | POA: Diagnosis not present

## 2018-05-06 DIAGNOSIS — R87810 Cervical high risk human papillomavirus (HPV) DNA test positive: Secondary | ICD-10-CM | POA: Diagnosis not present

## 2018-08-10 NOTE — Progress Notes (Signed)
DUE TO DEDUCTIBLE DO MINIMAL LABS  Complete Physical  Assessment and Plan: Iron deficiency anemia, unspecified iron deficiency anemia type -     Iron and TIBC  Hyperlipidemia, unspecified hyperlipidemia type -continue medications, check lipids, decrease fatty foods, increase activity.  -     TSH -     Lipid panel  Medication management -     CBC with Differential/Platelet -     BASIC METABOLIC PANEL WITH GFR -     Hepatic function panel -     Magnesium  Vitamin D deficiency -     VITAMIN D 25 Hydroxy (Vit-D Deficiency, Fractures)  Low grade squamous intraepithelial lesion (LGSIL) on Papanicolaou smear of cervix -  GYN in [place] - normal  Prediabetes -     Has had weight loss - recheck  Gastroesophageal reflux disease, esophagitis presence not specified Continue PPI/H2 blocker, diet discussed  HSV-1 infection Continue acyclovir  Encounter for general adult medical examination with abnormal findings  BMI  - long discussion about weight loss, diet, and exercise  Neck pain No peripheral symptoms, will do conservative treatment prescribed,NSAIDs, RICE, and exercise given If not better follow up in office or will refer to PT/orthopedics.   Discussed med's effects and SE's. Screening labs and tests as requested with regular follow-up as recommended. Over 40 minutes of exam, counseling, chart review, and complex, high level critical decision making was performed this visit.   HPI  43 y.o. female  presents for a complete physical and follow up for has Prediabetes; Obesity, unspecified; Iron deficiency anemia; Hyperlipidemia; Medication management; Vitamin D deficiency; HSV-1 infection; GERD (gastroesophageal reflux disease); and Low grade squamous intraepithelial lesion (LGSIL) on Papanicolaou smear of cervix on their problem list..  She has had right shoulder pain x 3 months, ibuprofen 200mg  will help for a few hours, she is taking no more of 6 a day, heat helps. No  numbness, tingling in arms, no weakness.   Her blood pressure has been controlled at home, today their BP is BP: 124/90 She does not workout. She denies chest pain, shortness of breath, dizziness.   Follows with GYN in [place] now, had PAP last year that was normal.    She is not on cholesterol medication and denies myalgias. Her cholesterol is not at goal. The cholesterol last visit was:   Lab Results  Component Value Date   CHOL 221 (H) 08/17/2017   HDL 43 (L) 08/17/2017   LDLCALC 147 (H) 08/17/2017   TRIG 169 (H) 08/17/2017   CHOLHDL 5.1 (H) 08/17/2017   She has been working on diet and exercise for prediabetes,   and denies paresthesia of the feet, polydipsia, polyuria and visual disturbances. Last A1C in the office was:  Lab Results  Component Value Date   HGBA1C 5.7 (H) 08/17/2016   Patient is on Vitamin D supplement, 10,000 IU a day.   Lab Results  Component Value Date   VD25OH 31 08/17/2017     BMI is Body mass index is 32.56 kg/m., she is working on diet and exercise. Wt Readings from Last 3 Encounters:  08/11/18 204 lb 12.8 oz (92.9 kg)  08/17/17 197 lb 9.6 oz (89.6 kg)  08/17/16 210 lb (95.3 kg)   She has iron def anemia Lab Results  Component Value Date   IRON 32 (L) 08/17/2017   TIBC 384 08/17/2017   FERRITIN 14 08/17/2016    Current Medications:  Current Outpatient Medications on File Prior to Visit  Medication Sig Dispense  Refill  . acyclovir (ZOVIRAX) 400 MG tablet TAKE 1 TABLET(400 MG) BY MOUTH DAILY 90 tablet 2   No current facility-administered medications on file prior to visit.    Allergies:  Allergies  Allergen Reactions  . Benadryl [Diphenhydramine] Rash   Medical History:  She has Prediabetes; Obesity, unspecified; Iron deficiency anemia; Hyperlipidemia; Medication management; Vitamin D deficiency; HSV-1 infection; GERD (gastroesophageal reflux disease); and Low grade squamous intraepithelial lesion (LGSIL) on Papanicolaou smear of  cervix on their problem list.   Health Maintenance:   Immunization History  Administered Date(s) Administered  . Influenza-Unspecified 07/12/2017  . Tdap 04/01/2015   Tetanus: 2016 Pneumovax: 1997 Prevnar 13: N/A Flu vaccine: 2019 Zostavax: N/A LMP: N/A  Pap: at GYN in [place] MGM: April 2019 in [place] DEXA: N/A Colonoscopy: N/A EGD: N/A  Patient Care Team: Lucky CowboyMcKeown, William, MD as PCP - General (Internal Medicine)  Surgical History:  She has no past surgical history on file.   Family History:  Herfamily history includes Asthma in her mother; Cancer in her father, maternal grandmother, and paternal aunt; Heart disease in her maternal grandfather; Hypertension in her father and mother; Stroke in her maternal grandmother.   Social History:  She reports that she has never smoked. She has never used smokeless tobacco. She reports that she does not drink alcohol or use drugs.  Review of Systems: Review of Systems  Constitutional: Negative.   HENT: Negative.   Eyes: Negative.   Respiratory: Negative.   Cardiovascular: Negative.   Gastrointestinal: Negative.   Genitourinary: Negative for frequency.  Musculoskeletal: Positive for neck pain.  Skin: Negative.     Physical Exam: Estimated body mass index is 32.56 kg/m as calculated from the following:   Height as of this encounter: 5' 6.5" (1.689 m).   Weight as of this encounter: 204 lb 12.8 oz (92.9 kg). BP 124/90   Pulse 88   Temp 97.8 F (36.6 C)   Ht 5' 6.5" (1.689 m)   Wt 204 lb 12.8 oz (92.9 kg)   LMP 07/15/2018   SpO2 99%   BMI 32.56 kg/m  General Appearance: Well nourished, in no apparent distress.  Eyes: PERRLA, EOMs, conjunctiva no swelling or erythema, normal fundi and vessels.  Sinuses: No Frontal/maxillary tenderness  ENT/Mouth: Ext aud canals clear, normal light reflex with TMs without erythema, bulging. Good dentition. No erythema, swelling, or exudate on post pharynx. Tonsils not swollen or  erythematous. Hearing normal.  Neck: Supple, thyroid normal. No bruits  Respiratory: Respiratory effort normal, BS equal bilaterally without rales, rhonchi, wheezing or stridor.  Cardio: RRR without murmurs, rubs or gallops. Brisk peripheral pulses without edema.  Chest: symmetric, with normal excursions and percussion.  Breasts: Symmetric, without lumps, nipple discharge, retractions.  Abdomen: Soft, nontender, no guarding, rebound, hernias, masses, or organomegaly.  Lymphatics: Non tender without lymphadenopathy.  Gen: defer Musculoskeletal: Full ROM all peripheral extremities,5/5 strength, and normal gait. supple, without spinous process tenderness, with paraspinal muscle tenderness the right side, normal sensation, reflexes, and pulses distal. Skin: Warm, dry without rashes, lesions, ecchymosis. Neuro: Cranial nerves intact, reflexes equal bilaterally. Normal muscle tone, no cerebellar symptoms. Sensation intact.  Psych: Awake and oriented X 3, normal affect, Insight and Judgment appropriate.   EKG: defer AORTA SCAN: defer   Quentin MullingAmanda Zenaya Ulatowski 2:32 PM [facility]

## 2018-08-11 ENCOUNTER — Ambulatory Visit (INDEPENDENT_AMBULATORY_CARE_PROVIDER_SITE_OTHER): Payer: BLUE CROSS/BLUE SHIELD | Admitting: Physician Assistant

## 2018-08-11 ENCOUNTER — Encounter: Payer: Self-pay | Admitting: Physician Assistant

## 2018-08-11 VITALS — BP 124/90 | HR 88 | Temp 97.8°F | Ht 66.5 in | Wt 204.8 lb

## 2018-08-11 DIAGNOSIS — Z1329 Encounter for screening for other suspected endocrine disorder: Secondary | ICD-10-CM | POA: Diagnosis not present

## 2018-08-11 DIAGNOSIS — Z79899 Other long term (current) drug therapy: Secondary | ICD-10-CM

## 2018-08-11 DIAGNOSIS — Z130 Encounter for screening for diseases of the blood and blood-forming organs and certain disorders involving the immune mechanism: Secondary | ICD-10-CM | POA: Diagnosis not present

## 2018-08-11 DIAGNOSIS — M542 Cervicalgia: Secondary | ICD-10-CM

## 2018-08-11 DIAGNOSIS — Z6832 Body mass index (BMI) 32.0-32.9, adult: Secondary | ICD-10-CM

## 2018-08-11 DIAGNOSIS — E559 Vitamin D deficiency, unspecified: Secondary | ICD-10-CM

## 2018-08-11 DIAGNOSIS — Z131 Encounter for screening for diabetes mellitus: Secondary | ICD-10-CM | POA: Diagnosis not present

## 2018-08-11 DIAGNOSIS — R7303 Prediabetes: Secondary | ICD-10-CM

## 2018-08-11 DIAGNOSIS — E785 Hyperlipidemia, unspecified: Secondary | ICD-10-CM

## 2018-08-11 DIAGNOSIS — Z0000 Encounter for general adult medical examination without abnormal findings: Secondary | ICD-10-CM

## 2018-08-11 DIAGNOSIS — Z0001 Encounter for general adult medical examination with abnormal findings: Secondary | ICD-10-CM

## 2018-08-11 DIAGNOSIS — D509 Iron deficiency anemia, unspecified: Secondary | ICD-10-CM

## 2018-08-11 DIAGNOSIS — Z13220 Encounter for screening for lipoid disorders: Secondary | ICD-10-CM

## 2018-08-11 DIAGNOSIS — K219 Gastro-esophageal reflux disease without esophagitis: Secondary | ICD-10-CM

## 2018-08-11 MED ORDER — CYCLOBENZAPRINE HCL 10 MG PO TABS: 10.0000 mg | ORAL_TABLET | Freq: Three times a day (TID) | ORAL | 0 refills | Status: DC | PRN

## 2018-08-11 MED ORDER — DICLOFENAC SODIUM 75 MG PO TBEC: DELAYED_RELEASE_TABLET | ORAL | 2 refills | Status: DC

## 2018-08-11 NOTE — Patient Instructions (Addendum)
Please continue to increase green leafy veggies.  Take an iron supplement daily 325mg  or 65mg  slow release daily take it with 500mg  Vit C to increase absorption, it can cause constipation or black stool so add on probiotic with the iron OR can try to take every other day.  We will monitor this closely.   Your LDL could improve, ideally we want it under a 100.  Your LDL is the bad cholesterol that can lead to heart attack and stroke. To lower your number you can decrease your fatty foods, red meat, cheese, milk and increase fiber like whole grains and veggies. You can also add a fiber supplement like Citracel or Benefiber, these do not cause gas and bloating and are safe to use. Especially if you have a strong family history of heart disease or stroke or you have evidence of plaque on any imaging like a chest xray, we may discuss at your next office visit putting you on a medication to get your number below 100.   GENERAL HEALTH GOALS  Know what a healthy weight is for you (roughly BMI <25) and aim to maintain this  Aim for 7+ servings of fruits and vegetables daily  70-80+ fluid ounces of water or unsweet tea for healthy kidneys  Limit to max 1 drink of alcohol per day; avoid smoking/tobacco  Limit animal fats in diet for cholesterol and heart health - choose grass fed whenever available  Avoid highly processed foods, and foods high in saturated/trans fats  Aim for low stress - take time to unwind and care for your mental health  Aim for 150 min of moderate intensity exercise weekly for heart health, and weights twice weekly for bone health  Aim for 7-9 hours of sleep daily   Drink 1/2 your body weight in fluid ounces of water daily; drink a tall glass of water 30 min before meals  Don't eat until you're stuffed- listen to your stomach and eat until you are 80% full   Try eating off of a salad plate; wait 10 min after finishing before going back for seconds  Start by eating the  vegetables on your plate; aim for 16% of your meals to be fruits or vegetables  Then eat your protein - lean meats (grass fed if possible), fish, beans, nuts in moderation  Eat your carbs/starch last ONLY if you still are hungry. If you can, stop before finishing it all  Avoid sugar and flour - the closer it looks to it's original form in nature, typically the better it is for you  Splurge in moderation - "assign" days when you get to splurge and have the "bad stuff" - I like to follow a 80% - 20% plan- "good" choices 80 % of the time, "bad" choices in moderation 20% of the time  Simple equation is: Calories out > calories in = weight loss - even if you eat the bad stuff, if you limit portions, you will still lose weight   MAXIMUM AMOUNT OF TYLENOL IN A DAY  You can take tylenol (500mg ) or tylenol arthritis (650mg ) with the meloxicam/antiinflammatories. The max you can take of tylenol a day is 3000mg  daily, this is a max of 6 pills a day of the regular tyelnol (500mg ) or a max of 4 a day of the tylenol arthritis (650mg ) as long as no other medications you are taking contain tylenol.    Try the exercises and other information below, diclofenac twice a day with food (avoid  taking other NSAIDS like Alleve or Ibuprofen while taking this) and then flexeril if needed at bedtime for muscle spasm. This can be taken up to every 8 hours, but causes sedation, so should not drive or operate heavy machinery while taking this medicine.   Go to the ER if you have any new weakness in your arms, trouble with your grip, worse headache ever, fever, chills. or have worsening pain.   If you are not better in 1-3 month we will refer you to ortho   Cervical Sprain A cervical sprain is a stretch or tear in one or more of the tough, cord-like tissues that connect bones (ligaments) in the neck. Cervical sprains can range from mild to severe. Severe cervical sprains can cause the spinal bones (vertebrae) in the neck  to be unstable. This can lead to spinal cord damage and can result in serious nervous system problems. The amount of time that it takes for a cervical sprain to get better depends on the cause and extent of the injury. Most cervical sprains heal in 4-6 weeks. What are the causes? Cervical sprains may be caused by an injury (trauma), such as from a motor vehicle accident, a fall, or sudden forward and backward whipping movement of the head and neck (whiplash injury). Mild cervical sprains may be caused by wear and tear over time, such as from poor posture, sitting in a chair that does not provide support, or looking up or down for long periods of time. What increases the risk? The following factors may make you more likely to develop this condition:  Participating in activities that have a high risk of trauma to the neck. These include contact sports, auto racing, gymnastics, and diving.  Taking risks when driving or riding in a motor vehicle, such as speeding.  Having osteoarthritis of the spine.  Having poor strength and flexibility of the neck.  A previous neck injury.  Having poor posture.  Spending a lot of time in certain positions that put stress on the neck, such as sitting at a computer for long periods of time.  What are the signs or symptoms? Symptoms of this condition include:  Pain, soreness, stiffness, tenderness, swelling, or a burning sensation in the front, back, or sides of the neck.  Sudden tightening of neck muscles that you cannot control (muscle spasms).  Pain in the shoulders or upper back.  Limited ability to move the neck.  Headache.  Dizziness.  Nausea.  Vomiting.  Weakness, numbness, or tingling in a hand or an arm.  Symptoms may develop right away after injury, or they may develop over a few days. In some cases, symptoms may go away with treatment and return (recur) over time. How is this diagnosed? This condition may be diagnosed based  on:  Your medical history.  Your symptoms.  Any recent injuries or known neck problems that you have, such as arthritis in the neck.  A physical exam.  Imaging tests, such as: ? X-rays. ? MRI. ? CT scan.  How is this treated? This condition is treated by resting and icing the injured area and doing physical therapy exercises. Depending on the severity of your condition, treatment may also include:  Keeping your neck in place (immobilized) for periods of time. This may be done using: ? A cervical collar. This supports your chin and the back of your head. ? A cervical traction device. This is a sling that holds up your head. This removes weight and pressure  from your neck, and it may help to relieve pain.  Medicines that help to relieve pain and inflammation.  Medicines that help to relax your muscles (muscle relaxants).  Surgery. This is rare.  Follow these instructions at home: If you have a cervical collar:  Wear it as told by your health care provider. Do not remove the collar unless instructed by your health care provider.  Ask your health care provider before you make any adjustments to your collar.  If you have long hair, keep it outside of the collar.  Ask your health care provider if you can remove the collar for cleaning and bathing. If you are allowed to remove the collar for cleaning or bathing: ? Follow instructions from your health care provider about how to remove the collar safely. ? Clean the collar by wiping it with mild soap and water and drying it completely. ? If your collar has removable pads, remove them every 1-2 days and wash them by hand with soap and water. Let them air-dry completely before you put them back in the collar. ? Check your skin under the collar for irritation or sores. If you see any, tell your health care provider. Managing pain, stiffness, and swelling  If directed, use a cervical traction device as told by your health care  provider.  If directed, apply heat to the affected area before you do your physical therapy or as often as told by your health care provider. Use the heat source that your health care provider recommends, such as a moist heat pack or a heating pad. ? Place a towel between your skin and the heat source. ? Leave the heat on for 20-30 minutes. ? Remove the heat if your skin turns bright red. This is especially important if you are unable to feel pain, heat, or cold. You may have a greater risk of getting burned.  If directed, put ice on the affected area: ? Put ice in a plastic bag. ? Place a towel between your skin and the bag. ? Leave the ice on for 20 minutes, 2-3 times a day. Activity  Do not drive while wearing a cervical collar. If you do not have a cervical collar, ask your health care provider if it is safe to drive while your neck heals.  Do not drive or use heavy machinery while taking prescription pain medicine or muscle relaxants, unless your health care provider approves.  Do not lift anything that is heavier than 10 lb (4.5 kg) until your health care provider tells you that it is safe.  Rest as directed by your health care provider. Avoid positions and activities that make your symptoms worse. Ask your health care provider what activities are safe for you.  If physical therapy was prescribed, do exercises as told by your health care provider or physical therapist. General instructions  Take over-the-counter and prescription medicines only as told by your health care provider.  Do not use any products that contain nicotine or tobacco, such as cigarettes and e-cigarettes. These can delay healing. If you need help quitting, ask your health care provider.  Keep all follow-up visits as told by your health care provider or physical therapist. This is important. How is this prevented? To prevent a cervical sprain from happening again:  Use and maintain good posture. Make any  needed adjustments to your workstation to help you use good posture.  Exercise regularly as directed by your health care provider or physical therapist.  Avoid risky  activities that may cause a cervical sprain.  Contact a health care provider if:  You have symptoms that get worse or do not get better after 2 weeks of treatment.  You have pain that gets worse or does not get better with medicine.  You develop new, unexplained symptoms.  You have sores or irritated skin on your neck from wearing your cervical collar. Get help right away if:  You have severe pain.  You develop numbness, tingling, or weakness in any part of your body.  You cannot move a part of your body (you have paralysis).  You have neck pain along with: ? Severe dizziness. ? Headache. Summary  A cervical sprain is a stretch or tear in one or more of the tough, cord-like tissues that connect bones (ligaments) in the neck.  Cervical sprains may be caused by an injury (trauma), such as from a motor vehicle accident, a fall, or sudden forward and backward whipping movement of the head and neck (whiplash injury).  Symptoms may develop right away after injury, or they may develop over a few days.  This condition is treated by resting and icing the injured area and doing physical therapy exercises. This information is not intended to replace advice given to you by your health care provider. Make sure you discuss any questions you have with your health care provider. Document Released: 04/12/2007 Document Revised: 02/12/2016 Document Reviewed: 02/12/2016 Elsevier Interactive Patient Education  2017 [employer]   Cervical Strain and Sprain Rehab Ask your health care provider which exercises are safe for you. Do exercises exactly as told by your health care provider and adjust them as directed. It is normal to feel mild stretching, pulling, tightness, or discomfort as you do these exercises, but you should stop  right away if you feel sudden pain or your pain gets worse.Do not begin these exercises until told by your health care provider. Stretching and range of motion exercises These exercises warm up your muscles and joints and improve the movement and flexibility of your neck. These exercises also help to relieve pain, numbness, and tingling. Exercise A: Cervical side bend  10. Using good posture, sit on a stable chair or stand up. 11. Without moving your shoulders, slowly tilt your left / right ear to your shoulder until you feel a stretch in your neck muscles. You should be looking straight ahead. 12. Hold for __________ seconds. 13. Repeat with the other side of your neck. Repeat __________ times. Complete this exercise __________ times a day. Exercise B: Cervical rotation  1. Using good posture, sit on a stable chair or stand up. 2. Slowly turn your head to the side as if you are looking over your left / right shoulder. ? Keep your eyes level with the ground. ? Stop when you feel a stretch along the side and the back of your neck. 3. Hold for __________ seconds. 4. Repeat this by turning to your other side. Repeat __________ times. Complete this exercise __________ times a day. Exercise C: Thoracic extension and pectoral stretch 1. Roll a towel or a small blanket so it is about 4 inches (10 cm) in diameter. 2. Lie down on your back on a firm surface. 3. Put the towel lengthwise, under your spine in the middle of your back. It should not be not under your shoulder blades. The towel should line up with your spine from your middle back to your lower back. 4. Put your hands behind your head and let  your elbows fall out to your sides. 5. Hold for __________ seconds. Repeat __________ times. Complete this exercise __________ times a day. Strengthening exercises These exercises build strength and endurance in your neck. Endurance is the ability to use your muscles for a long time, even after your  muscles get tired. Exercise D: Upper cervical flexion, isometric 1. Lie on your back with a thin pillow behind your head and a small rolled-up towel under your neck. 2. Gently tuck your chin toward your chest and nod your head down to look toward your feet. Do not lift your head off the pillow. 3. Hold for __________ seconds. 4. Release the tension slowly. Relax your neck muscles completely before you repeat this exercise. Repeat __________ times. Complete this exercise __________ times a day. Exercise E: Cervical extension, isometric  1. Stand about 6 inches (15 cm) away from a wall, with your back facing the wall. 2. Place a soft object, about 6-8 inches (15-20 cm) in diameter, between the back of your head and the wall. A soft object could be a small pillow, a ball, or a folded towel. 3. Gently tilt your head back and press into the soft object. Keep your jaw and forehead relaxed. 4. Hold for __________ seconds. 5. Release the tension slowly. Relax your neck muscles completely before you repeat this exercise. Repeat __________ times. Complete this exercise __________ times a day. Posture and body mechanics  Body mechanics refers to the movements and positions of your body while you do your daily activities. Posture is part of body mechanics. Good posture and healthy body mechanics can help to relieve stress in your body's tissues and joints. Good posture means that your spine is in its natural S-curve position (your spine is neutral), your shoulders are pulled back slightly, and your head is not tipped forward. The following are general guidelines for applying improved posture and body mechanics to your everyday activities. Standing  When standing, keep your spine neutral and keep your feet about hip-width apart. Keep a slight bend in your knees. Your ears, shoulders, and hips should line up.  When you do a task in which you stand in one place for a long time, place one foot up on a stable  object that is 2-4 inches (5-10 cm) high, such as a footstool. This helps keep your spine neutral. Sitting   When sitting, keep your spine neutral and your keep feet flat on the floor. Use a footrest, if necessary, and keep your thighs parallel to the floor. Avoid rounding your shoulders, and avoid tilting your head forward.  When working at a desk or a computer, keep your desk at a height where your hands are slightly lower than your elbows. Slide your chair under your desk so you are close enough to maintain good posture.  When working at a computer, place your monitor at a height where you are looking straight ahead and you do not have to tilt your head forward or downward to look at the screen. Resting When lying down and resting, avoid positions that are most painful for you. Try to support your neck in a neutral position. You can use a contour pillow or a small rolled-up towel. Your pillow should support your neck but not push on it. This information is not intended to replace advice given to you by your health care provider. Make sure you discuss any questions you have with your health care provider. Document Released: 06/15/2005 Document Revised: 02/20/2016 Document Reviewed: 05/22/2015  [employer] Patient Education  2018 [employer]vier

## 2018-08-12 LAB — COMPLETE METABOLIC PANEL WITH GFR
AG RATIO: 1.3 (calc) (ref 1.0–2.5)
ALBUMIN MSPROF: 4.4 g/dL (ref 3.6–5.1)
ALKALINE PHOSPHATASE (APISO): 86 U/L (ref 31–125)
ALT: 12 U/L (ref 6–29)
AST: 15 U/L (ref 10–30)
BILIRUBIN TOTAL: 0.4 mg/dL (ref 0.2–1.2)
BUN: 9 mg/dL (ref 7–25)
CHLORIDE: 102 mmol/L (ref 98–110)
CO2: 27 mmol/L (ref 20–32)
Calcium: 9.9 mg/dL (ref 8.6–10.2)
Creat: 0.91 mg/dL (ref 0.50–1.10)
GFR, Est African American: 91 mL/min/{1.73_m2} (ref >=60)
GFR, Est Non African American: 79 mL/min/{1.73_m2} (ref >=60)
GLOBULIN: 3.5 g/dL (ref 1.9–3.7)
Glucose, Bld: 73 mg/dL (ref 65–99)
POTASSIUM: 4.6 mmol/L (ref 3.5–5.3)
Sodium: 138 mmol/L (ref 135–146)
Total Protein: 7.9 g/dL (ref 6.1–8.1)

## 2018-08-12 LAB — CBC WITH DIFFERENTIAL/PLATELET
Absolute Monocytes: 424 cells/uL (ref 200–950)
BASOS ABS: 42 {cells}/uL (ref 0–200)
Basophils Relative: 0.8 %
EOS PCT: 2.5 %
Eosinophils Absolute: 133 cells/uL (ref 15–500)
HCT: 38.6 % (ref 35.0–45.0)
HEMOGLOBIN: 12.7 g/dL (ref 11.7–15.5)
Lymphs Abs: 1961 cells/uL (ref 850–3900)
MCH: 25.1 pg — ABNORMAL LOW (ref 27.0–33.0)
MCHC: 32.9 g/dL (ref 32.0–36.0)
MCV: 76.4 fL — ABNORMAL LOW (ref 80.0–100.0)
MONOS PCT: 8 %
MPV: 10.6 fL (ref 7.5–12.5)
NEUTROS ABS: 2740 {cells}/uL (ref 1500–7800)
Neutrophils Relative %: 51.7 %
PLATELETS: 399 10*3/uL (ref 140–400)
RBC: 5.05 10*6/uL (ref 3.80–5.10)
RDW: 15.1 % — ABNORMAL HIGH (ref 11.0–15.0)
Total Lymphocyte: 37 %
WBC: 5.3 10*3/uL (ref 3.8–10.8)

## 2018-08-12 LAB — IRON, TOTAL/TOTAL IRON BINDING CAP
%SAT: 7 % (calc) — ABNORMAL LOW (ref 16–45)
IRON: 28 ug/dL — AB (ref 40–190)
TIBC: 396 mcg/dL (calc) (ref 250–450)

## 2018-08-12 LAB — HEMOGLOBIN A1C
EAG (MMOL/L): 6.8 (calc)
Hgb A1c MFr Bld: 5.9 % of total Hgb — ABNORMAL HIGH (ref <5.7)
Mean Plasma Glucose: 123 (calc)

## 2018-08-12 LAB — LIPID PANEL
CHOLESTEROL: 226 mg/dL — AB (ref <200)
HDL: 45 mg/dL — AB (ref >=50)
LDL Cholesterol (Calc): 155 mg/dL (calc) — ABNORMAL HIGH
NON-HDL CHOLESTEROL (CALC): 181 mg/dL — AB (ref <130)
TRIGLYCERIDES: 132 mg/dL (ref <150)
Total CHOL/HDL Ratio: 5 (calc) — ABNORMAL HIGH (ref <5.0)

## 2018-08-12 LAB — TSH: TSH: 2.19 m[IU]/L

## 2018-08-12 LAB — VITAMIN D 25 HYDROXY (VIT D DEFICIENCY, FRACTURES): VIT D 25 HYDROXY: 23 ng/mL — AB (ref 30–100)

## 2018-08-12 LAB — FERRITIN: Ferritin: 17 ng/mL (ref 16–154)

## 2018-09-12 ENCOUNTER — Telehealth: Payer: Self-pay

## 2018-09-12 ENCOUNTER — Other Ambulatory Visit: Payer: Self-pay

## 2018-09-12 ENCOUNTER — Telehealth: Payer: Self-pay | Admitting: Physician Assistant

## 2018-09-12 MED ORDER — ACYCLOVIR 400 MG PO TABS: ORAL_TABLET | ORAL | 2 refills | Status: DC

## 2018-09-12 NOTE — Telephone Encounter (Signed)
-----   Message from Gregery Na, CMA sent at 09/12/2018 12:29 PM EDT ----- Regarding: work Patient reports she has prediabetes/diabetes.  Should she be at work?

## 2018-09-12 NOTE — Telephone Encounter (Signed)
Discussed, with patient, she is not smoker, she does not have lung or heart disease. Instructed ,if she can work from home for her to do so but to do good hand washing precautions.  Encouraged patient to get on my chart.

## 2018-09-12 NOTE — Telephone Encounter (Signed)
Called patient informed that at this time there is no need for her to be released from work.

## 2018-11-26 DIAGNOSIS — S0093XA Contusion of unspecified part of head, initial encounter: Secondary | ICD-10-CM | POA: Diagnosis not present

## 2018-11-26 DIAGNOSIS — S0081XA Abrasion of other part of head, initial encounter: Secondary | ICD-10-CM | POA: Diagnosis not present

## 2019-02-10 DIAGNOSIS — N390 Urinary tract infection, site not specified: Secondary | ICD-10-CM | POA: Diagnosis not present

## 2019-02-22 ENCOUNTER — Telehealth: Payer: Self-pay | Admitting: Physician Assistant

## 2019-02-22 MED ORDER — ACYCLOVIR 400 MG PO TABS: ORAL_TABLET | ORAL | 2 refills | Status: DC

## 2019-02-22 NOTE — Telephone Encounter (Signed)
Recent Visits Date Type Provider Dept  08/11/18 Office Visit Vicie Mutters, PA-C [employer] recent visits within past 540 days with a meds authorizing provider and meeting all other requirements   Future Appointments No visits were found meeting these conditions.  Showing future appointments within next 150 days with a meds authorizing provider and meeting all other requirements

## 2019-02-22 NOTE — Telephone Encounter (Signed)
-----   Message from Angela D Duff, CMA sent at 02/22/2019 12:41 PM EDT ----- Regarding: med refill Contact: [phone] Last office visit----FEB 2020  Next office visit------FEB 17th 2020   Patient would like a refill on ACYCLOVIR.   Please advise---more then 6mths will pass before next office visit      [facility]/ [address]/ [place]  

## 2019-02-22 NOTE — Telephone Encounter (Signed)
-----   Message from Elenor Quinones, [place] sent at 02/22/2019 12:41 PM EDT ----- Regarding: med refill Contact: [phone] Last office visit----FEB 2020  Next office visit------FEB 17th 2020   Patient would like a refill on ACYCLOVIR.   Please advise---more then 110mths will pass before next office visit      [facility]/ [address]/ [place]

## 2019-08-16 ENCOUNTER — Encounter: Payer: Self-pay | Admitting: Physician Assistant

## 2019-09-07 DIAGNOSIS — B009 Herpesviral infection, unspecified: Secondary | ICD-10-CM | POA: Diagnosis not present

## 2019-09-07 DIAGNOSIS — Z1151 Encounter for screening for human papillomavirus (HPV): Secondary | ICD-10-CM | POA: Diagnosis not present

## 2019-09-07 DIAGNOSIS — Z113 Encounter for screening for infections with a predominantly sexual mode of transmission: Secondary | ICD-10-CM | POA: Diagnosis not present

## 2019-09-07 DIAGNOSIS — R87612 Low grade squamous intraepithelial lesion on cytologic smear of cervix (LGSIL): Secondary | ICD-10-CM | POA: Diagnosis not present

## 2019-09-07 DIAGNOSIS — R87610 Atypical squamous cells of undetermined significance on cytologic smear of cervix (ASC-US): Secondary | ICD-10-CM | POA: Diagnosis not present

## 2019-09-07 DIAGNOSIS — Z01419 Encounter for gynecological examination (general) (routine) without abnormal findings: Secondary | ICD-10-CM | POA: Diagnosis not present

## 2019-09-07 DIAGNOSIS — Z01411 Encounter for gynecological examination (general) (routine) with abnormal findings: Secondary | ICD-10-CM | POA: Diagnosis not present

## 2019-09-07 DIAGNOSIS — R87810 Cervical high risk human papillomavirus (HPV) DNA test positive: Secondary | ICD-10-CM | POA: Diagnosis not present

## 2020-02-16 ENCOUNTER — Other Ambulatory Visit: Payer: Self-pay

## 2020-02-16 ENCOUNTER — Ambulatory Visit (INDEPENDENT_AMBULATORY_CARE_PROVIDER_SITE_OTHER): Payer: BC Managed Care – PPO | Admitting: Physician Assistant

## 2020-02-16 ENCOUNTER — Encounter: Payer: Self-pay | Admitting: Physician Assistant

## 2020-02-16 VITALS — BP 120/82 | HR 100 | Temp 98.1°F | Wt 197.2 lb

## 2020-02-16 DIAGNOSIS — G4452 New daily persistent headache (NDPH): Secondary | ICD-10-CM

## 2020-02-16 DIAGNOSIS — M542 Cervicalgia: Secondary | ICD-10-CM | POA: Diagnosis not present

## 2020-02-16 MED ORDER — ACYCLOVIR 400 MG PO TABS: ORAL_TABLET | ORAL | 3 refills | Status: DC

## 2020-02-16 MED ORDER — CYCLOBENZAPRINE HCL 10 MG PO TABS: 10.0000 mg | ORAL_TABLET | Freq: Every evening | ORAL | 0 refills | Status: DC | PRN

## 2020-02-16 NOTE — Progress Notes (Signed)
Subjective:    Patient ID: Karina Berry, female    DOB: 11-18-1975, 44 y.o.   MRN: [id number]  HPI 44 y.o. AAF with HTN and headache last week. She has been more stressful at work, she is having anxiety about possibly having to go back to work.   At work she has been learning new functions, new process for new job title at [employer]. She started to have HA. Felt tightness back of her head, bilateral temples. Worse back of her head. One time she had it when she work up.  She had no nausea, no vision changes, no sensitivity to light or sound. Took 2 aleve occasionally that helped.   So she took  Her BP on her wrist and it was high BP this past weekend but she had just walked up the stairs. She has been retaking it properly and doing better.   BP Readings from Last 5 Encounters:  02/16/20 120/82  08/11/18 124/90  08/17/17 128/66  08/17/16 126/90  01/13/16 124/82   Blood pressure 120/82, pulse 100, temperature 98.1 F (36.7 C), weight 197 lb 3.2 oz (89.4 kg), SpO2 98 %.  Lab Results  Component Value Date   IRON 28 (L) 08/11/2018   TIBC 396 08/11/2018   FERRITIN 17 08/11/2018   Lab Results  Component Value Date   TSH 2.19 08/11/2018   Lab Results  Component Value Date   CREATININE 0.91 08/11/2018   BUN 9 08/11/2018   NA 138 08/11/2018   K 4.6 08/11/2018   CL 102 08/11/2018   CO2 27 08/11/2018    Medications     Current Outpatient Medications (Analgesics):  .  diclofenac (VOLTAREN) 75 MG EC tablet, 1 pill twice a day with food, do not take ibuprofen or aleve with this, can take tylenol   Current Outpatient Medications (Other):  .  acyclovir (ZOVIRAX) 400 MG tablet, TAKE 1 TABLET(400 MG) BY MOUTH DAILY .  cyclobenzaprine (FLEXERIL) 10 MG tablet, Take 1 tablet (10 mg total) by mouth 3 (three) times daily as needed for muscle spasms.  Problem list She has Prediabetes; Obesity, unspecified; Iron deficiency anemia; Hyperlipidemia; Medication management; Vitamin D  deficiency; HSV-1 infection; GERD (gastroesophageal reflux disease); and Low grade squamous intraepithelial lesion (LGSIL) on Papanicolaou smear of cervix on their problem list.   Review of Systems  Constitutional: Negative.  Negative for chills, fatigue and fever.  HENT: Negative.  Negative for congestion, sore throat and trouble swallowing.   Respiratory: Negative.   Cardiovascular: Negative.   Genitourinary: Negative.   Musculoskeletal: Positive for neck pain. Negative for arthralgias, back pain, gait problem, joint swelling, myalgias and neck stiffness.  Skin: Negative.  Negative for rash.  Neurological: Positive for headaches. Negative for dizziness, tremors, seizures, syncope, facial asymmetry, speech difficulty, weakness, light-headedness and numbness.  Psychiatric/Behavioral: Negative for agitation and confusion.       Objective:   Physical Exam Constitutional:      Appearance: She is well-developed.  HENT:     Head: Normocephalic and atraumatic.  Eyes:     Conjunctiva/sclera: Conjunctivae normal.     Pupils: Pupils are equal, round, and reactive to light.  Cardiovascular:     Rate and Rhythm: Normal rate and regular rhythm.  Pulmonary:     Effort: Pulmonary effort is normal.     Breath sounds: Normal breath sounds.  Abdominal:     General: Bowel sounds are normal.     Palpations: Abdomen is soft.     Tenderness:  There is no abdominal tenderness.  Musculoskeletal:     Cervical back: Normal range of motion and neck supple.     Comments: normal range of motion, without spinous process tenderness, with paraspinal muscle tenderness both sides, normal sensation, reflexes, and pulses distal.  Lymphadenopathy:     Cervical: No cervical adenopathy.  Skin:    General: Skin is warm and dry.     Findings: No rash.  Neurological:     Mental Status: She is alert and oriented to person, place, and time.     Cranial Nerves: Cranial nerves are intact. No cranial nerve deficit or  dysarthria.     Sensory: Sensation is intact.     Motor: Motor function is intact. No weakness, tremor or pronator drift.     Coordination: Coordination is intact. Finger-Nose-Finger Test normal.     Gait: Gait is intact.     Deep Tendon Reflexes: Reflexes are normal and symmetric.           Assessment & Plan:    Neck pain/ headache/ elevated BP? Normal BP in the office, normal neuro exam,  + neck pain- possible tension /cervogenic HA Will give flexeril, will check into insurance for PT She was informed to call 911 if she develop any new symptoms such as worsening headaches, episodes of blurred vision, double vision or complete loss of vision or speech difficulties or motor weakness. -     cyclobenzaprine (FLEXERIL) 10 MG tablet; Take 1 tablet (10 mg total) by mouth at bedtime as needed for muscle spasms. Declines Berry Continue aleve  HSV 1 -     acyclovir (ZOVIRAX) 400 MG tablet; TAKE 1 TABLET(400 MG) BY MOUTH DAILY     Future Appointments  Date Time Provider Department Center  08/15/2020  2:00 PM Quentin Mulling, PA-C [id number] None

## 2020-02-16 NOTE — Patient Instructions (Addendum)
Can take aleve 1 pill twice for headache as needed   flexeril if needed at bedtime for muscle spasm. This can be taken up to every 8 hours, but causes sedation, so should not drive or operate heavy machinery while taking this medicine.   Go to the ER if you have any new weakness in your arms, trouble with your grip, worse headache ever, fever, chills. or have worsening pain.   If you are not better in 1-3 month we will refer you to ortho  Check for physical therapy benefits through your work  z Cervicogenic Headache  A cervicogenic headache is a headache caused by a condition that affects the bones and tissues in your neck (cervical spine). In a cervicogenic headache, the pain moves from your neck to your head. Most cervicogenic headaches start in the upper part of the neck with the first three cervical bones (cervical vertebrae). A cervicogenic headache is diagnosed when a cause can be found in the cervical spine and other causes of headaches can be ruled out. What are the causes? The most common cause of this condition is a traumatic injury to the cervical spine, such as whiplash. Other causes include:  Arthritis.  Broken bone (fracture).  Infection.  Tumor. What are the signs or symptoms? The most common symptoms are neck and head pain. The pain is often located on one side. In some cases, there may be head pain without neck pain. Pain may be felt in the neck, back or side of the head, face, or behind the eyes. Other symptoms include:  Limited movement in the neck.  Arm or shoulder pain. How is this diagnosed? This condition may be diagnosed based on:  Your symptoms.  A physical exam.  An injection that blocks nerve signals (nerve block).  Imaging tests, such as: ? X-rays. ? CT scan. ? MRI. How is this treated? Treatment for this condition may depend on the underlying condition. Treatment may include:  Medicines, such as: ? NSAIDs. ? Muscle relaxants.  Physical  therapy.  Massage therapy.  Complementary therapies, such as: ? Biofeedback. ? Meditation. ? Acupuncture.  Nerve block injections.  Botulinum toxin injections. Your treatment plan may involve working with a pain management team that includes your primary health care provider, a pain management specialist, a neurologist, and a physical therapist. Follow these instructions at home:  Take over-the-counter and prescription medicines only as told by your health care provider.  Do exercises at home as told by your physical therapist.  Return to your normal activities as told by your health care provider. Ask your health care provider what activities are safe for you. Avoid activities that trigger your headaches.  Maintain good neck support and posture at home and at work.  Keep all follow-up visits as told by your health care provider. This is important. Contact a health care provider if you have:  Headaches that are getting worse and happening more often.  Headaches with any of the following: ? Fever. ? Numbness. ? Weakness. ? Dizziness. ? Nausea or vomiting. Get help right away if:  You have a very sudden and severe headache. Summary  A cervicogenic headache is a headache caused by a condition that affects the bones and tissues in your cervical spine.  Your health care provider may diagnose this condition with a physical exam, a nerve block, and imaging tests.  Treatment may include medicine to reduce pain and inflammation, physical therapy, and nerve block injections.  Complementary therapies, such as acupuncture  and meditation, may be added to other treatments.  Your treatment plan may involve working with a pain management team that includes your primary health care provider, a pain management specialist, a neurologist, and a physical therapist. This information is not intended to replace advice given to you by your health care provider. Make sure you discuss any  questions you have with your health care provider. Document Revised: 10/05/2018 Document Reviewed: 06/25/2017 Elsevier Patient Education  2020 [employer].

## 2020-02-20 ENCOUNTER — Other Ambulatory Visit: Payer: Self-pay

## 2020-02-20 MED ORDER — ACYCLOVIR 400 MG PO TABS: ORAL_TABLET | ORAL | 3 refills | Status: AC

## 2020-02-20 NOTE — Progress Notes (Signed)
Patient requested that Acyclovir be transferred to [facility] at [facility] in [place].

## 2020-02-27 ENCOUNTER — Encounter: Payer: Self-pay | Admitting: Physician Assistant

## 2020-02-27 ENCOUNTER — Other Ambulatory Visit: Payer: Self-pay

## 2020-02-27 ENCOUNTER — Ambulatory Visit (INDEPENDENT_AMBULATORY_CARE_PROVIDER_SITE_OTHER): Payer: BC Managed Care – PPO | Admitting: Physician Assistant

## 2020-02-27 VITALS — BP 124/80 | HR 99 | Temp 97.5°F | Wt 202.0 lb

## 2020-02-27 DIAGNOSIS — F419 Anxiety disorder, unspecified: Secondary | ICD-10-CM | POA: Diagnosis not present

## 2020-02-27 DIAGNOSIS — D509 Iron deficiency anemia, unspecified: Secondary | ICD-10-CM

## 2020-02-27 DIAGNOSIS — G4452 New daily persistent headache (NDPH): Secondary | ICD-10-CM

## 2020-02-27 DIAGNOSIS — Z79899 Other long term (current) drug therapy: Secondary | ICD-10-CM

## 2020-02-27 MED ORDER — SERTRALINE HCL 50 MG PO TABS: 50.0000 mg | ORAL_TABLET | Freq: Every day | ORAL | 2 refills | Status: DC

## 2020-02-27 NOTE — Progress Notes (Signed)
Subjective:    Patient ID: Karina Berry, female    DOB: 1976/01/24, 44 y.o.   MRN: [id number]  HPI 44 y.o. AAF presents with right ear pain.   She was recently seen in the office on 02/16/2020 for Headache x 1 week with some elevated BP at home, this had resolved by the time she had come into the office.  She did have neck pain but normal neuro, was given flexeril for possible tension/neck pain but she states it is not helping.   She has had sharp right ear pain x last week, has been in tears. She also has posterior occipital head pain. She is having a hard time concentrating due to the headache at work.  She had no nausea, no vision changes, no sensitivity to light or sound.  She has a lot of anxiety, she has been having panic attacks. She has been having to lay down after work, they are changing progress.    Blood pressure 124/80, pulse 99, temperature (!) 97.5 F (36.4 C), weight 202 lb (91.6 kg), SpO2 98 %.  Medications  Current Outpatient Medications (Endocrine & Metabolic):  .  predniSONE (DELTASONE) 20 MG tablet, 2 tablets daily for 3 days, 1 tablet daily for 4 days.    Current Outpatient Medications (Analgesics):  .  diclofenac (VOLTAREN) 75 MG EC tablet, 1 pill twice a day with food, do not take ibuprofen or aleve with this, can take tylenol   Current Outpatient Medications (Other):  .  acyclovir (ZOVIRAX) 400 MG tablet, TAKE 1 TABLET(400 MG) BY MOUTH DAILY .  cyclobenzaprine (FLEXERIL) 10 MG tablet, Take 1 tablet (10 mg total) by mouth at bedtime as needed for muscle spasms. Marland Kitchen  sertraline (ZOLOFT) 50 MG tablet, Take 1 tablet (50 mg total) by mouth at bedtime.  Problem list She has Prediabetes; Obesity, unspecified; Iron deficiency anemia; Hyperlipidemia; Medication management; Vitamin D deficiency; HSV-1 infection; GERD (gastroesophageal reflux disease); and Low grade squamous intraepithelial lesion (LGSIL) on Papanicolaou smear of cervix on their problem  list.    Review of Systems  Constitutional: Negative.  Negative for chills, fatigue and fever.  HENT: Negative.  Negative for congestion, sore throat and trouble swallowing.   Respiratory: Negative.   Cardiovascular: Negative.   Genitourinary: Negative.   Musculoskeletal: Positive for neck pain. Negative for arthralgias, back pain, gait problem, joint swelling, myalgias and neck stiffness.  Skin: Negative.  Negative for rash.  Neurological: Positive for headaches. Negative for dizziness, tremors, seizures, syncope, facial asymmetry, speech difficulty, weakness, light-headedness and numbness.  Psychiatric/Behavioral: Positive for decreased concentration. Negative for agitation and confusion. The patient is nervous/anxious.        Objective:   Physical Exam Constitutional:      Appearance: She is well-developed.  HENT:     Head: Normocephalic and atraumatic.     Mouth/Throat:     Lips: Pink.     Mouth: Mucous membranes are moist. No oral lesions.     Tongue: Tongue does not deviate from midline.     Pharynx: Oropharynx is clear. No pharyngeal swelling or posterior oropharyngeal erythema.     Comments: Lower right pharynx, uvula still midline Eyes:     General: No visual field deficit.    Conjunctiva/sclera: Conjunctivae normal.     Pupils: Pupils are equal, round, and reactive to light.  Cardiovascular:     Rate and Rhythm: Normal rate and regular rhythm.  Pulmonary:     Effort: Pulmonary effort is normal.  Breath sounds: Normal breath sounds.  Abdominal:     General: Bowel sounds are normal.     Palpations: Abdomen is soft.     Tenderness: There is no abdominal tenderness.  Musculoskeletal:     Cervical back: Normal range of motion and neck supple.     Comments: normal range of motion, without spinous process tenderness, with paraspinal muscle tenderness both sides, normal sensation, reflexes, and pulses distal.  Lymphadenopathy:     Cervical: No cervical adenopathy.   Skin:    General: Skin is warm and dry.     Findings: No rash.  Neurological:     Mental Status: She is alert and oriented to person, place, and time.     Cranial Nerves: Cranial nerves are intact. No cranial nerve deficit, dysarthria or facial asymmetry.     Sensory: Sensation is intact.     Motor: Motor function is intact. No weakness, tremor or pronator drift.     Coordination: Coordination is intact. Finger-Nose-Finger Test (slightly abnormal finger to nose but patient very anxious) normal.     Gait: Gait is intact. Gait normal.     Deep Tendon Reflexes: Reflexes are normal and symmetric.        Assessment & Plan:   New daily persistent headache- has not improved- happening daily- some neck stiffness, normal neuro other than some abnormal finger to nose and slight relaxation of right posterior pharynax- will proceed with imaging- ER precautions discussed with patient, has follow up in 2 weeks, will possible check autoimmune basics at that time-  -     Sedimentation rate -     C-reactive protein -     predniSONE (DELTASONE) 20 MG tablet; 2 tablets daily for 3 days, 1 tablet daily for 4 days. -     CT Head W Contrast; Future  Iron deficiency anemia, unspecified iron deficiency anemia type -     Iron,Total/Total Iron Binding Cap -     Vitamin B12  Medication management -     CBC with Differential/Platelet -     COMPLETE METABOLIC PANEL WITH GFR -     Magnesium  Anxiety -     TSH -     sertraline (ZOLOFT) 50 MG tablet; Take 1 tablet (50 mg total) by mouth at bedtime.   Due to daily persistent headache- will do short term disability for the patient until we are able to do imaging and medication management. Patient works at [employer] and is unable to work with the pain/decreased concentration.  Start date 02/21/2020 and end date Sept 27th

## 2020-02-27 NOTE — Patient Instructions (Addendum)
Please try the zoloft 1/2 pill at night for 2 weeks, then can go up to a hole pill at night Follow up 2 weeks  Will go to the ER if worsening headache, changes vision/speech, imbalance, weakness.  General Headache Without Cause A headache is pain or discomfort felt around the head or neck area. The specific cause of a headache may not be found. There are many causes and types of headaches. A few common ones are:  Tension headaches.  Migraine headaches.  Cluster headaches.  Chronic daily headaches. Follow these instructions at home: Watch your condition for any changes. Let your health care provider know about them. Take these steps to help with your condition: Managing pain      Take over-the-counter and prescription medicines only as told by your health care provider.  Lie down in a dark, quiet room when you have a headache.  If directed, put ice on your head and neck area: ? Put ice in a plastic bag. ? Place a towel between your skin and the bag. ? Leave the ice on for 20 minutes, 2-3 times per day.  If directed, apply heat to the affected area. Use the heat source that your health care provider recommends, such as a moist heat pack or a heating pad. ? Place a towel between your skin and the heat source. ? Leave the heat on for 20-30 minutes. ? Remove the heat if your skin turns bright red. This is especially important if you are unable to feel pain, heat, or cold. You may have a greater risk of getting burned.  Keep lights dim if bright lights bother you or make your headaches worse. Eating and drinking  Eat meals on a regular schedule.  If you drink alcohol: ? Limit how much you use to:  0-1 drink a day for women.  0-2 drinks a day for men. ? Be aware of how much alcohol is in your drink. In the [place] one drink equals one 12 oz bottle of beer (355 mL), one 5 oz glass of wine (148 mL), or one 1 oz glass of hard liquor (44 mL).  Stop drinking caffeine, or decrease  the amount of caffeine you drink. General instructions   Keep a headache journal to help find out what may trigger your headaches. For example, write down: ? What you eat and drink. ? How much sleep you get. ? Any change to your diet or medicines.  Try massage or other relaxation techniques.  Limit stress.  Sit up straight, and do not tense your muscles.  Do not use any products that contain nicotine or tobacco, such as cigarettes, e-cigarettes, and chewing tobacco. If you need help quitting, ask your health care provider.  Exercise regularly as told by your health care provider.  Sleep on a regular schedule. Get 7-9 hours of sleep each night, or the amount recommended by your health care provider.  Keep all follow-up visits as told by your health care provider. This is important. Contact a health care provider if:  Your symptoms are not helped by medicine.  You have a headache that is different from the usual headache.  You have nausea or you vomit.  You have a fever. Get help right away if:  Your headache becomes severe quickly.  Your headache gets worse after moderate to intense physical activity.  You have repeated vomiting.  You have a stiff neck.  You have a loss of vision.  You have problems with speech.  You have pain in the eye or ear.  You have muscular weakness or loss of muscle control.  You lose your balance or have trouble walking.  You feel faint or pass out.  You have confusion.  You have a seizure. Summary  A headache is pain or discomfort felt around the head or neck area.  There are many causes and types of headaches. In some cases, the cause may not be found.  Keep a headache journal to help find out what may trigger your headaches. Watch your condition for any changes. Let your health care provider know about them.  Contact a health care provider if you have a headache that is different from the usual headache, or if your symptoms  are not helped by medicine.  Get help right away if your headache becomes severe, you vomit, you have a loss of vision, you lose your balance, or you have a seizure. This information is not intended to replace advice given to you by your health care provider. Make sure you discuss any questions you have with your health care provider. Document Revised: 01/03/2018 Document Reviewed: 01/03/2018 Elsevier Patient Education  2020 [employer].

## 2020-02-28 LAB — CBC WITH DIFFERENTIAL/PLATELET
Absolute Monocytes: 329 cells/uL (ref 200–950)
Basophils Absolute: 38 cells/uL (ref 0–200)
Basophils Relative: 0.7 %
Eosinophils Absolute: 108 cells/uL (ref 15–500)
Eosinophils Relative: 2 %
HCT: 38.8 % (ref 35.0–45.0)
Hemoglobin: 11.9 g/dL (ref 11.7–15.5)
Lymphs Abs: 1485 cells/uL (ref 850–3900)
MCH: 23.4 pg — ABNORMAL LOW (ref 27.0–33.0)
MCHC: 30.7 g/dL — ABNORMAL LOW (ref 32.0–36.0)
MCV: 76.2 fL — ABNORMAL LOW (ref 80.0–100.0)
MPV: 10 fL (ref 7.5–12.5)
Monocytes Relative: 6.1 %
Neutro Abs: 3440 cells/uL (ref 1500–7800)
Neutrophils Relative %: 63.7 %
Platelets: 452 10*3/uL — ABNORMAL HIGH (ref 140–400)
RBC: 5.09 10*6/uL (ref 3.80–5.10)
RDW: 14.8 % (ref 11.0–15.0)
Total Lymphocyte: 27.5 %
WBC: 5.4 10*3/uL (ref 3.8–10.8)

## 2020-02-28 LAB — IRON, TOTAL/TOTAL IRON BINDING CAP
%SAT: 6 % (calc) — ABNORMAL LOW (ref 16–45)
Iron: 25 ug/dL — ABNORMAL LOW (ref 40–190)
TIBC: 424 mcg/dL (calc) (ref 250–450)

## 2020-02-28 LAB — COMPLETE METABOLIC PANEL WITH GFR
AG Ratio: 1.3 (calc) (ref 1.0–2.5)
ALT: 11 U/L (ref 6–29)
AST: 16 U/L (ref 10–30)
Albumin: 4.2 g/dL (ref 3.6–5.1)
Alkaline phosphatase (APISO): 103 U/L (ref 31–125)
BUN: 9 mg/dL (ref 7–25)
CO2: 25 mmol/L (ref 20–32)
Calcium: 10.1 mg/dL (ref 8.6–10.2)
Chloride: 104 mmol/L (ref 98–110)
Creat: 0.79 mg/dL (ref 0.50–1.10)
GFR, Est African American: 107 mL/min/{1.73_m2} (ref >=60)
GFR, Est Non African American: 92 mL/min/{1.73_m2} (ref >=60)
Globulin: 3.3 g/dL (calc) (ref 1.9–3.7)
Glucose, Bld: 100 mg/dL — ABNORMAL HIGH (ref 65–99)
Potassium: 4.3 mmol/L (ref 3.5–5.3)
Sodium: 138 mmol/L (ref 135–146)
Total Bilirubin: 0.3 mg/dL (ref 0.2–1.2)
Total Protein: 7.5 g/dL (ref 6.1–8.1)

## 2020-02-28 LAB — SEDIMENTATION RATE: Sed Rate: 39 mm/h — ABNORMAL HIGH (ref 0–20)

## 2020-02-28 LAB — VITAMIN B12: Vitamin B-12: 338 pg/mL (ref 200–1100)

## 2020-02-28 LAB — C-REACTIVE PROTEIN: CRP: 23 mg/L — ABNORMAL HIGH (ref <8.0)

## 2020-02-28 LAB — MAGNESIUM: Magnesium: 2 mg/dL (ref 1.5–2.5)

## 2020-02-28 LAB — TSH: TSH: 1.25 mIU/L

## 2020-02-28 MED ORDER — PREDNISONE 20 MG PO TABS: ORAL_TABLET | ORAL | 0 refills | Status: DC

## 2020-02-29 ENCOUNTER — Telehealth: Payer: Self-pay

## 2020-02-29 NOTE — Telephone Encounter (Signed)
Patient has been informed that her FMLA forms were received & pt will be informed once completed.

## 2020-03-07 ENCOUNTER — Other Ambulatory Visit: Payer: Self-pay | Admitting: Physician Assistant

## 2020-03-11 NOTE — Progress Notes (Signed)
Subjective:    Patient ID: Karina Berry, female    DOB: [birth year], 44 y.o.   MRN: [id number]  HPI 44 y.o. AAF presents for follow up with Headache with elevated BP and ear pain with difficulty concentrating.   She had normal CT head with and without contrast.   She continues to have headaches but much less frequent and not lasting as long, frontal HA, lasted 30 mins, no exacerbating issues. She completed the prednisone and she has been having less neck pain and feeling better overall. She did go to eye doctor and is getting eye glasses soon.   She had no nausea, no vision changes, no sensitivity to light or sound.  She is on zoloft 50mg  for anxiety at night and states she is doing better.    Blood pressure 120/82, pulse 100, temperature (!) 97.3 F (36.3 C), weight 199 lb (90.3 kg), SpO2 97 %.  Medications     Current Outpatient Medications (Analgesics):    diclofenac (VOLTAREN) 75 MG EC tablet, 1 pill twice a day with food, do not take ibuprofen or aleve with this, can take tylenol   Current Outpatient Medications (Other):    acyclovir (ZOVIRAX) 400 MG tablet, TAKE 1 TABLET(400 MG) BY MOUTH DAILY   cyclobenzaprine (FLEXERIL) 10 MG tablet, Take 1 tablet (10 mg total) by mouth at bedtime as needed for muscle spasms.   sertraline (ZOLOFT) 50 MG tablet, Take 1 tablet (50 mg total) by mouth at bedtime.  Problem list She has Prediabetes; Obesity, unspecified; Iron deficiency anemia; Hyperlipidemia; Medication management; Vitamin D deficiency; HSV-1 infection; GERD (gastroesophageal reflux disease); and Low grade squamous intraepithelial lesion (LGSIL) on Papanicolaou smear of cervix on their problem list.    Review of Systems  Constitutional: Negative.  Negative for chills, fatigue and fever.  HENT: Negative.  Negative for congestion, sore throat and trouble swallowing.   Respiratory: Negative.   Cardiovascular: Negative.   Genitourinary: Negative.   Musculoskeletal:  Positive for neck pain. Negative for arthralgias, back pain, gait problem, joint swelling, myalgias and neck stiffness.  Skin: Negative.  Negative for rash.  Neurological: Positive for headaches. Negative for dizziness, tremors, seizures, syncope, facial asymmetry, speech difficulty, weakness, light-headedness and numbness.  Psychiatric/Behavioral: Positive for decreased concentration. Negative for agitation and confusion. The patient is nervous/anxious.        Objective:   Physical Exam Constitutional:      Appearance: She is well-developed.  HENT:     Head: Normocephalic and atraumatic.     Mouth/Throat:     Lips: Pink.     Mouth: Mucous membranes are moist. No oral lesions.     Tongue: Tongue does not deviate from midline.     Pharynx: Oropharynx is clear. No pharyngeal swelling or posterior oropharyngeal erythema.     Comments: Lower right pharynx, uvula still midline Eyes:     General: No visual field deficit.    Conjunctiva/sclera: Conjunctivae normal.     Pupils: Pupils are equal, round, and reactive to light.  Cardiovascular:     Rate and Rhythm: Normal rate and regular rhythm.  Pulmonary:     Effort: Pulmonary effort is normal.     Breath sounds: Normal breath sounds.  Abdominal:     General: Bowel sounds are normal.     Palpations: Abdomen is soft.     Tenderness: There is no abdominal tenderness.  Musculoskeletal:     Cervical back: Normal range of motion and neck supple.     Comments:  normal range of motion, without spinous process tenderness, with paraspinal muscle tenderness both sides, normal sensation, reflexes, and pulses distal.  Lymphadenopathy:     Cervical: No cervical adenopathy.  Skin:    General: Skin is warm and dry.     Findings: No rash.  Neurological:     Mental Status: She is alert and oriented to person, place, and time.     Cranial Nerves: Cranial nerves are intact. No cranial nerve deficit, dysarthria or facial asymmetry.     Sensory:  Sensation is intact.     Motor: Motor function is intact. No weakness, tremor or pronator drift.     Coordination: Coordination is intact. Finger-Nose-Finger Test normal.     Gait: Gait is intact. Gait normal.     Deep Tendon Reflexes: Reflexes are normal and symmetric.        Assessment & Plan:   New daily persistent headache- has improved- will have once a week or less- declines Berry and neck imaging today- if not better will get Cervical Xray/repeat Berry Anxiety -   CT head negative - will do imitrex as needed for possible migraine component Will get neck Xray if not better -     sertraline (ZOLOFT) 50 MG tablet; Take 1 tablet (50 mg total) by mouth at bedtime.    Future Appointments  Date Time Provider Department Center  08/15/2020  2:00 PM Elder Negus, NP [facility] None

## 2020-03-12 ENCOUNTER — Ambulatory Visit
Admission: RE | Admit: 2020-03-12 | Discharge: 2020-03-12 | Disposition: A | Discharge status: Home / Self Care | Payer: BLUE CROSS/BLUE SHIELD | Source: Ambulatory Visit | Attending: Physician Assistant | Admitting: Physician Assistant

## 2020-03-12 DIAGNOSIS — G4452 New daily persistent headache (NDPH): Secondary | ICD-10-CM

## 2020-03-12 MED ORDER — IOPAMIDOL (ISOVUE-300) INJECTION 61%
75.0000 mL | Freq: Once | INTRAVENOUS | Status: AC | PRN
  Administered 2020-03-12: 75 mL via INTRAVENOUS

## 2020-03-13 ENCOUNTER — Encounter: Payer: Self-pay | Admitting: Physician Assistant

## 2020-03-13 ENCOUNTER — Other Ambulatory Visit: Payer: Self-pay

## 2020-03-13 ENCOUNTER — Ambulatory Visit (INDEPENDENT_AMBULATORY_CARE_PROVIDER_SITE_OTHER): Payer: BC Managed Care – PPO | Admitting: Physician Assistant

## 2020-03-13 VITALS — BP 120/82 | HR 100 | Temp 97.3°F | Wt 199.0 lb

## 2020-03-13 DIAGNOSIS — F419 Anxiety disorder, unspecified: Secondary | ICD-10-CM

## 2020-03-13 DIAGNOSIS — M542 Cervicalgia: Secondary | ICD-10-CM

## 2020-03-13 DIAGNOSIS — G4452 New daily persistent headache (NDPH): Secondary | ICD-10-CM

## 2020-03-13 MED ORDER — SUMATRIPTAN SUCCINATE 50 MG PO TABS: 50.0000 mg | ORAL_TABLET | Freq: Once | ORAL | 2 refills | Status: DC | PRN

## 2020-03-13 NOTE — Patient Instructions (Addendum)
You had a normal CT scan  I think it is either from your neck or possibly migraines.  Can get neck xray if continues or any arm pain  Continue zoloft to see if this helps when you go back to work  Will send in imitrex, as needed migraine medication for you to try at the onset of a migraine, AS NEEDED   1.  Limit use of pain relievers to no more than 2 days out of week to prevent risk of rebound or medication-overuse headache. 2.  Keep headache diary 3.  Exercise, hydration, caffeine cessation, sleep hygiene, monitor for and avoid triggers 4.  Consider:  magnesium citrate 400mg  daily, riboflavin 400mg  daily, and coenzyme Q10 100mg  three times daily   We may also treat TMJ if we think you have it If you are having frequent migraines we may put you on a once a day medication with fast acting medication to take. Also there is such a thing called rebound headache from over use of acute medications.  Please do not use rescue or acute medications more than 10 days a month or more than 3 days per week, this can cause a withdrawal and a rebound headache.  Here is more information below  Please remember, common headache triggers are: sleep deprivation, dehydration, overheating, stress, hypoglycemia or skipping meals and blood sugar fluctuations, excessive pain medications or excessive alcohol use or caffeine withdrawal. Some people have food triggers such as aged cheese, orange juice or chocolate, especially dark chocolate, or MSG (monosodium glutamate). Try to avoid these headache triggers as much possible.   It may be helpful to keep a headache diary to figure out what makes your headaches worse or brings them on and what alleviates them. Some people report headache onset after exercise but studies have shown that regular exercise may actually prevent headaches from coming. If you have exercise-induced headaches, please make sure that you drink plenty of fluid before and after exercising and that you  do not over do it and do not overheat.   Please go to the ER if there is weakness, thunderclap headache, visual changes, or any concerning factors    Migraine Headache A migraine headache is an intense, throbbing pain on one or both sides of your head. Recurrent migraines keep coming back. A migraine can last for 30 minutes to several hours. CAUSES  The exact cause of a migraine headache is not always known. However, a migraine may be caused when nerves in the brain become irritated and release chemicals that cause inflammation. This causes pain. Certain things may also trigger migraines, such as:   Alcohol.  Smoking.  Stress.  Menstruation.  Aged cheeses.  Foods or drinks that contain nitrates, glutamate, aspartame, or tyramine.  Lack of sleep.  Chocolate.  Caffeine.  Hunger.  Physical exertion.  Fatigue.  Medicines used to treat chest pain (nitroglycerine), birth control pills, estrogen, and some blood pressure medicines. SYMPTOMS   Pain on one or both sides of your head.  Pulsating or throbbing pain.  Severe pain that prevents daily activities.  Pain that is aggravated by any physical activity.  Nausea, vomiting, or both.  Dizziness.  Pain with exposure to bright lights, loud noises, or activity.  General sensitivity to bright lights, loud noises, or smells. Before you get a migraine, you may get warning signs that a migraine is coming (aura). An aura may include:  Seeing flashing lights.  Seeing bright spots, halos, or zigzag lines.  Having tunnel vision  or blurred vision.  Having feelings of numbness or tingling.  Having trouble talking.  Having muscle weakness. DIAGNOSIS  A recurrent migraine headache is often diagnosed based on:  Symptoms.  Physical examination.  A CT scan or MRI of your head. These imaging tests cannot diagnose migraines but can help rule out other causes of headaches.   TREATMENT  Medicines may be given for pain  and nausea. Medicines can also be given to help prevent recurrent migraines. HOME CARE INSTRUCTIONS  Only take over-the-counter or prescription medicines for pain or discomfort as directed by your health care provider. The use of long-term narcotics is not recommended.  Lie down in a dark, quiet room when you have a migraine.  Keep a journal to find out what may trigger your migraine headaches. For example, write down:  What you eat and drink.  How much sleep you get.  Any change to your diet or medicines.  Limit alcohol consumption.  Quit smoking if you smoke.  Get 7-9 hours of sleep, or as recommended by your health care provider.  Limit stress.  Keep lights dim if bright lights bother you and make your migraines worse. SEEK MEDICAL CARE IF:   You do not get relief from the medicines given to you.  You have a recurrence of pain.  You have a fever. SEEK IMMEDIATE MEDICAL CARE IF:  Your migraine becomes severe.  You have a stiff neck.  You have loss of vision.  You have muscular weakness or loss of muscle control.  You start losing your balance or have trouble walking.  You feel faint or pass out. . You have severe symptoms that are different from your first symptoms. MAKE SURE YOU:   Understand these instructions.  Will watch your condition.  Will get help right away if you are not doing well or get worse.   This information is not intended to replace advice given to you by your health care provider. Make sure you discuss any questions you have with your health care provider.   Document Released: 03/10/2001 Document Revised: 07/06/2014 Document Reviewed: 02/20/2013 Elsevier Interactive Patient Education 2016 [employer].  Common Migraine Triggers   Foods Aged cheese, alcohol, nuts, chocolate, yogurt, onions, figs, liver, caffeinated foods and beverages, monosodium glutamate (MSG), smoked or pickled fish/meat, nitrate/nitrate preserved foods (hotdogs,  pepperoni, salami) tyramine  Medications Antibiotics (tetracycline, griseofulvin), antihypertensives (nifedipine, captopril), hormones (oral contraceptives, estrogens), histamine-2 blockers (cimetidine, raniidine, vasodilators (nitroglycerine, isosorbide dinitrate)  Sensory Stimuli Flickering/bright/fluorescent lights, bright sunlight, odors (perfume, chemicals, cigarette smoke)  Lifestyle Changes Time zones, sleep patterns, eating habits, caffeine withdrawal stress  Other Menstrual cycle, weather/season/air pressure changes, high altitude  Adapted from [place], [employer]. Clin. J. Med. 1995; [place] and [place]. Conquering Headache, 1998  Hormonal variations also are believed to play a part.  Fluctuations of the female hormone estrogen (such as just before menstruation) affect a chemical called serotonin-when serotonin levels in the brain fall, the dilation (expansion) of blood vessels in the brain that is characteristic of migraine often follows.  Many factors or "triggers" can start a migraine.  In people who get migraines, most experts think certain activities or foods may trigger temporary changes in the blood vessels around the brain.  Swelling of these blood vessels may cause pain in the nearby nerves.  Allergy Headaches:  Hotdogs Milk  Onions  Thyme Bacon  Chocolate Garlic  Nutmeg Ham  Dark Cola Pork  Cinnamon Salami  Nuts  Egg  Ginger Sausage Red  wine Cloves  Cheddar Cheese Caffeine   Sumatriptan tablets What is this medicine? SUMATRIPTAN (soo ma TRIP tan) is used to treat migraines with or without aura. An aura is a strange feeling or visual disturbance that warns you of an attack. It is not used to prevent migraines. This medicine may be used for other purposes; ask your health care provider or pharmacist if you have questions. COMMON BRAND NAME(S): Imitrex, Migraine Pack What should I tell my health care provider before I take this medicine? They need to know if you  have any of these conditions:  cigarette smoker  circulation problems in fingers and toes  diabetes  heart disease  high blood pressure  high cholesterol  history of irregular heartbeat  history of stroke  kidney disease  liver disease  stomach or intestine problems  an unusual or allergic reaction to sumatriptan, other medicines, foods, dyes, or preservatives  pregnant or trying to get pregnant  breast-feeding How should I use this medicine? Take this medicine by mouth with a glass of water. Follow the directions on the prescription label. Do not take it more often than directed. Talk to your pediatrician regarding the use of this medicine in children. Special care may be needed. Overdosage: If you think you have taken too much of this medicine contact a poison control center or emergency room at once. NOTE: This medicine is only for you. Do not share this medicine with others. What if I miss a dose? This does not apply. This medicine is not for regular use. What may interact with this medicine? Do not take this medicine with any of the following medicines:  certain medicines for migraine headache like almotriptan, eletriptan, frovatriptan, naratriptan, rizatriptan, sumatriptan, zolmitriptan  ergot alkaloids like dihydroergotamine, ergonovine, ergotamine, methylergonovine  MAOIs like Carbex, Eldepryl, Marplan, Nardil, and Parnate This medicine may also interact with the following medications:  certain medicines for depression, anxiety, or psychotic disorders This list may not describe all possible interactions. Give your health care provider a list of all the medicines, herbs, non-prescription drugs, or dietary supplements you use. Also tell them if you smoke, drink alcohol, or use illegal drugs. Some items may interact with your medicine. What should I watch for while using this medicine? Visit your healthcare professional for regular checks on your progress. Tell  your healthcare professional if your symptoms do not start to get better or if they get worse. You may get drowsy or dizzy. Do not drive, use machinery, or do anything that needs mental alertness until you know how this medicine affects you. Do not stand up or sit up quickly, especially if you are an older patient. This reduces the risk of dizzy or fainting spells. Alcohol may interfere with the effect of this medicine. Tell your healthcare professional right away if you have any change in your eyesight. If you take migraine medicines for 10 or more days a month, your migraines may get worse. Keep a diary of headache days and medicine use. Contact your healthcare professional if your migraine attacks occur more frequently. What side effects may I notice from receiving this medicine? Side effects that you should report to your doctor or health care professional as soon as possible:  allergic reactions like skin rash, itching or hives, swelling of the face, lips, or tongue  changes in vision  chest pain or chest tightness  signs and symptoms of a dangerous change in heartbeat or heart rhythm like chest pain; dizziness; fast, irregular heartbeat; palpitations;  feeling faint or lightheaded; falls; breathing problems  signs and symptoms of a stroke like changes in vision; confusion; trouble speaking or understanding; severe headaches; sudden numbness or weakness of the face, arm or leg; trouble walking; dizziness; loss of balance or coordination  signs and symptoms of serotonin syndrome like irritable; confusion; diarrhea; fast or irregular heartbeat; muscle twitching; stiff muscles; trouble walking; sweating; high fever; seizures; chills; vomiting Side effects that usually do not require medical attention (report to your doctor or health care professional if they continue or are bothersome):  diarrhea  dizziness  drowsiness  dry mouth  headache  nausea, vomiting  pain, tingling, numbness  in the hands or feet  stomach pain This list may not describe all possible side effects. Call your doctor for medical advice about side effects. You may report side effects to FDA at 1-800-FDA-1088. Where should I keep my medicine? Keep out of the reach of children. Store at room temperature between 2 and 30 degrees C (36 and 86 degrees F). Throw away any unused medicine after the expiration date. NOTE: This sheet is a summary. It may not cover all possible information. If you have questions about this medicine, talk to your doctor, pharmacist, or health care provider.  2020 Elsevier/Gold Standard (2017-12-28 15:05:37)

## 2020-03-20 DIAGNOSIS — M542 Cervicalgia: Secondary | ICD-10-CM

## 2020-03-20 DIAGNOSIS — F419 Anxiety disorder, unspecified: Secondary | ICD-10-CM

## 2020-03-21 MED ORDER — CYCLOBENZAPRINE HCL 10 MG PO TABS: 10.0000 mg | ORAL_TABLET | Freq: Every evening | ORAL | 1 refills | Status: DC | PRN

## 2020-03-21 MED ORDER — SERTRALINE HCL 50 MG PO TABS: 50.0000 mg | ORAL_TABLET | Freq: Every day | ORAL | 2 refills | Status: DC

## 2020-07-09 ENCOUNTER — Other Ambulatory Visit: Payer: Self-pay

## 2020-07-09 DIAGNOSIS — F419 Anxiety disorder, unspecified: Secondary | ICD-10-CM

## 2020-07-09 MED ORDER — SERTRALINE HCL 50 MG PO TABS: 50.0000 mg | ORAL_TABLET | Freq: Every day | ORAL | 2 refills | Status: DC

## 2020-07-09 NOTE — Telephone Encounter (Signed)
REFILL ON SERTRALINE 50 mgs

## 2020-08-12 ENCOUNTER — Other Ambulatory Visit: Payer: Self-pay

## 2020-08-12 ENCOUNTER — Ambulatory Visit (INDEPENDENT_AMBULATORY_CARE_PROVIDER_SITE_OTHER): Payer: BC Managed Care – PPO | Admitting: Adult Health Nurse Practitioner

## 2020-08-12 ENCOUNTER — Encounter: Payer: Self-pay | Admitting: Adult Health Nurse Practitioner

## 2020-08-12 VITALS — BP 130/82 | HR 104 | Temp 97.5°F | Ht 67.0 in | Wt 209.0 lb

## 2020-08-12 DIAGNOSIS — E559 Vitamin D deficiency, unspecified: Secondary | ICD-10-CM

## 2020-08-12 DIAGNOSIS — Z131 Encounter for screening for diabetes mellitus: Secondary | ICD-10-CM | POA: Diagnosis not present

## 2020-08-12 DIAGNOSIS — Z13220 Encounter for screening for lipoid disorders: Secondary | ICD-10-CM | POA: Diagnosis not present

## 2020-08-12 DIAGNOSIS — I10 Essential (primary) hypertension: Secondary | ICD-10-CM

## 2020-08-12 DIAGNOSIS — Z1389 Encounter for screening for other disorder: Secondary | ICD-10-CM | POA: Diagnosis not present

## 2020-08-12 DIAGNOSIS — D509 Iron deficiency anemia, unspecified: Secondary | ICD-10-CM

## 2020-08-12 DIAGNOSIS — Z0001 Encounter for general adult medical examination with abnormal findings: Secondary | ICD-10-CM

## 2020-08-12 DIAGNOSIS — Z1329 Encounter for screening for other suspected endocrine disorder: Secondary | ICD-10-CM | POA: Diagnosis not present

## 2020-08-12 DIAGNOSIS — Z130 Encounter for screening for diseases of the blood and blood-forming organs and certain disorders involving the immune mechanism: Secondary | ICD-10-CM | POA: Diagnosis not present

## 2020-08-12 DIAGNOSIS — Z0000 Encounter for general adult medical examination without abnormal findings: Secondary | ICD-10-CM | POA: Diagnosis not present

## 2020-08-12 DIAGNOSIS — E785 Hyperlipidemia, unspecified: Secondary | ICD-10-CM

## 2020-08-12 DIAGNOSIS — Z136 Encounter for screening for cardiovascular disorders: Secondary | ICD-10-CM

## 2020-08-12 DIAGNOSIS — F419 Anxiety disorder, unspecified: Secondary | ICD-10-CM

## 2020-08-12 DIAGNOSIS — G43009 Migraine without aura, not intractable, without status migrainosus: Secondary | ICD-10-CM

## 2020-08-12 MED ORDER — SERTRALINE HCL 50 MG PO TABS: 50.0000 mg | ORAL_TABLET | Freq: Every day | ORAL | 2 refills | Status: DC

## 2020-08-12 MED ORDER — SUMATRIPTAN SUCCINATE 50 MG PO TABS: 50.0000 mg | ORAL_TABLET | Freq: Once | ORAL | 2 refills | Status: AC | PRN

## 2020-08-12 NOTE — Addendum Note (Signed)
Addended byElder Negus A on: 08/12/2020 02:49 PM   Modules accepted: Orders

## 2020-08-12 NOTE — Addendum Note (Signed)
Addended byElder Negus A on: 08/12/2020 11:10 AM   Modules accepted: Orders

## 2020-08-12 NOTE — Progress Notes (Addendum)
Complete Physical  Assessment and Plan:  Encounter for general adult medical examination with abnormal findings Yearly Due for Mammogram, discussed with patient  Iron deficiency anemia, unspecified iron deficiency anemia type -     Iron and TIBC Taking Slow Fe no consistantly  Hyperlipidemia, unspecified hyperlipidemia type -No medications, check lipids, decrease fatty foods, increase activity.  -     Lipid panel  Vitamin D deficiency Continue supplementation to maintain goal of 70-100 Taking Vitamin D 1,000 IU 3tablets daily -     VITAMIN D 25 Hydroxy (Vit-D Deficiency, Fractures)  Low grade squamous intraepithelial lesion (LGSIL) on Papanicolaou smear of cervix -  GYN in [place] - normal  Prediabetes Discussed dietary and exercise modifications - recheck  Gastroesophageal reflux disease, esophagitis presence not specified Continue PPI/H2 blocker, diet discussed No medication at this time  HSV-1 infection Continue acyclovir PRN  BMI  - long discussion about weight loss, diet, and exercise  Medication management Continued  Discussed med's effects and SE's. Screening labs and tests as requested with regular follow-up as recommended. Over 40 minutes of exam, counseling, chart review, and complex, high level critical decision making was performed this visit.     HPI  45 y.o. female  presents for a complete physical and follow up for has Prediabetes; Obesity, unspecified; Iron deficiency anemia; Hyperlipidemia; Medication management; Vitamin D deficiency; HSV-1 infection; GERD (gastroesophageal reflux disease); and Low grade squamous intraepithelial lesion (LGSIL) on Papanicolaou smear of cervix on their problem list..  She is having headaches to the right back side of her head.  She reports that is has increased in frequency over the course of the past two weeks. Occurrence is every other day.  She reports that are intermittent in nature.  She previously used  sumatriptan but has been out of medication.  Her blood pressure has been controlled at home, today their BP is BP: 130/82 She does not workout. She denies chest pain, shortness of breath, dizziness.   Follows with GYN in [place] now, had PAP last year that was normal.    She has never had screening mammogram.  Discussed with patient at length today.  Has family history of breast cancer.  Patient to call to schedule this.  She is not on cholesterol medication and denies myalgias. Her cholesterol is not at goal. The cholesterol last visit was not to goal:   Lab Results  Component Value Date   CHOL 226 (H) 08/11/2018   HDL 45 (L) 08/11/2018   LDLCALC 155 (H) 08/11/2018   TRIG 132 08/11/2018   CHOLHDL 5.0 (H) 08/11/2018   She has been working on diet and exercise for prediabetes,   and denies paresthesia of the feet, polydipsia, polyuria and visual disturbances. Last A1C in the office was:  Lab Results  Component Value Date   HGBA1C 5.9 (H) 08/11/2018   Patient is on Vitamin D supplement, 10,000 IU a day.   Lab Results  Component Value Date   VD25OH 23 (L) 08/11/2018     BMI is Body mass index is 32.73 kg/m., she is working on diet and exercise. Wt Readings from Last 3 Encounters:  08/12/20 209 lb (94.8 kg)  03/13/20 199 lb (90.3 kg)  02/27/20 202 lb (91.6 kg)   She has iron def anemia Lab Results  Component Value Date   IRON 25 (L) 02/27/2020   TIBC 424 02/27/2020   FERRITIN 17 08/11/2018    Current Medications:  Current Outpatient Medications on File Prior to Visit  Medication Sig Dispense Refill  . acyclovir (ZOVIRAX) 400 MG tablet TAKE 1 TABLET(400 MG) BY MOUTH DAILY 90 tablet 3   No current facility-administered medications on file prior to visit.   Allergies:  Allergies  Allergen Reactions  . Benadryl [Diphenhydramine] Rash   Medical History:  She has Prediabetes; Obesity, unspecified; Iron deficiency anemia; Hyperlipidemia; Medication management; Vitamin  D deficiency; HSV-1 infection; GERD (gastroesophageal reflux disease); and Low grade squamous intraepithelial lesion (LGSIL) on Papanicolaou smear of cervix on their problem list.   Health Maintenance:   Immunization History  Administered Date(s) Administered  . Influenza Inj Mdck Quad Pf 07/12/2017  . Influenza-Unspecified 07/12/2017  . PFIZER Comirnaty(Gray Top)Covid-19 Tri-Sucrose Vaccine 09/28/2019, 10/21/2019, 05/25/2020  . Tdap 04/01/2015   Tetanus: 2016 Pneumovax: 1997 Prevnar 13: N/A Flu vaccine: 2019 Zostavax: N/A LMP: N/A  Pap: at GYN in [place] MGM: April 2019 in [place] DEXA: N/A Colonoscopy: N/A EGD: N/A  Patient Care Team: Lucky Cowboy, MD as PCP - General (Internal Medicine)  Surgical History:  She has no past surgical history on file.   Family History:  Herfamily history includes Asthma in her mother; Cancer in her father, maternal grandmother, and paternal aunt; Heart disease in her maternal grandfather; Hypertension in her father and mother; Stroke in her maternal grandmother.   Social History:  She reports that she has never smoked. She has never used smokeless tobacco. She reports that she does not drink alcohol and does not use drugs.  Review of Systems: Review of Systems  Constitutional: Negative.   HENT: Negative.   Eyes: Negative.   Respiratory: Negative.   Cardiovascular: Negative.   Gastrointestinal: Negative.   Genitourinary: Negative for frequency.  Musculoskeletal: Positive for neck pain.  Skin: Negative.     Physical Exam: Estimated body mass index is 32.73 kg/m as calculated from the following:   Height as of this encounter: 5\' 7"  (1.702 m).   Weight as of this encounter: 209 lb (94.8 kg). BP 130/82   Pulse (!) 104   Temp (!) 97.5 F (36.4 C)   Ht 5\' 7"  (1.702 m)   Wt 209 lb (94.8 kg)   SpO2 98%   BMI 32.73 kg/m   General Appearance: Well nourished, in no apparent distress.  Eyes: PERRLA, EOMs, conjunctiva no  swelling or erythema, normal fundi and vessels.  Sinuses: No Frontal/maxillary tenderness  ENT/Mouth: Ext aud canals clear, normal light reflex with TMs without erythema, bulging. Good dentition. No erythema, swelling, or exudate on post pharynx. Tonsils not swollen or erythematous. Hearing normal.  Neck: Supple, thyroid normal. No bruits  Respiratory: Respiratory effort normal, BS equal bilaterally without rales, rhonchi, wheezing or stridor.  Cardio: RRR without murmurs, rubs or gallops. Brisk peripheral pulses without edema.  Chest: symmetric, with normal excursions and percussion.  Breasts: Symmetric, without lumps, nipple discharge, retractions.  Abdomen: Soft, nontender, no guarding, rebound, hernias, masses, or organomegaly.  Lymphatics: Non tender without lymphadenopathy.  Gen: defer Musculoskeletal: Full ROM all peripheral extremities,5/5 strength, and normal gait. supple, without spinous process tenderness, with paraspinal muscle tenderness the right side, normal sensation, reflexes, and pulses distal. Skin: Warm, dry without rashes, lesions, ecchymosis. Neuro: Cranial nerves intact, reflexes equal bilaterally. Normal muscle tone, no cerebellar symptoms. Sensation intact.  Psych: Awake and oriented X 3, normal affect, Insight and Judgment appropriate.     , , DNP [facility] 08/12/2020  9:48 AM

## 2020-08-13 LAB — URINALYSIS W MICROSCOPIC + REFLEX CULTURE
Bacteria, UA: NONE SEEN /HPF
Bilirubin Urine: NEGATIVE
Glucose, UA: NEGATIVE
Hgb urine dipstick: NEGATIVE
Hyaline Cast: NONE SEEN /LPF
Ketones, ur: NEGATIVE
Leukocyte Esterase: NEGATIVE
Nitrites, Initial: NEGATIVE
Protein, ur: NEGATIVE
Specific Gravity, Urine: 1.016 (ref 1.001–1.03)
WBC, UA: NONE SEEN /HPF (ref 0–5)
pH: 6 (ref 5.0–8.0)

## 2020-08-13 LAB — COMPLETE METABOLIC PANEL WITH GFR
AG Ratio: 1.3 (calc) (ref 1.0–2.5)
ALT: 10 U/L (ref 6–29)
AST: 14 U/L (ref 10–30)
Albumin: 4 g/dL (ref 3.6–5.1)
Alkaline phosphatase (APISO): 88 U/L (ref 31–125)
BUN: 11 mg/dL (ref 7–25)
CO2: 25 mmol/L (ref 20–32)
Calcium: 10.1 mg/dL (ref 8.6–10.2)
Chloride: 103 mmol/L (ref 98–110)
Creat: 0.85 mg/dL (ref 0.50–1.10)
GFR, Est African American: 98 mL/min/{1.73_m2} (ref >=60)
GFR, Est Non African American: 85 mL/min/{1.73_m2} (ref >=60)
Globulin: 3.1 g/dL (calc) (ref 1.9–3.7)
Glucose, Bld: 89 mg/dL (ref 65–99)
Potassium: 4.4 mmol/L (ref 3.5–5.3)
Sodium: 138 mmol/L (ref 135–146)
Total Bilirubin: 0.4 mg/dL (ref 0.2–1.2)
Total Protein: 7.1 g/dL (ref 6.1–8.1)

## 2020-08-13 LAB — CBC WITH DIFFERENTIAL/PLATELET
Absolute Monocytes: 413 cells/uL (ref 200–950)
Basophils Absolute: 39 cells/uL (ref 0–200)
Basophils Relative: 0.7 %
Eosinophils Absolute: 138 cells/uL (ref 15–500)
Eosinophils Relative: 2.5 %
HCT: 35.9 % (ref 35.0–45.0)
Hemoglobin: 11.5 g/dL — ABNORMAL LOW (ref 11.7–15.5)
Lymphs Abs: 1502 cells/uL (ref 850–3900)
MCH: 24.5 pg — ABNORMAL LOW (ref 27.0–33.0)
MCHC: 32 g/dL (ref 32.0–36.0)
MCV: 76.4 fL — ABNORMAL LOW (ref 80.0–100.0)
MPV: 9.6 fL (ref 7.5–12.5)
Monocytes Relative: 7.5 %
Neutro Abs: 3410 cells/uL (ref 1500–7800)
Neutrophils Relative %: 62 %
Platelets: 397 10*3/uL (ref 140–400)
RBC: 4.7 10*6/uL (ref 3.80–5.10)
RDW: 15.1 % — ABNORMAL HIGH (ref 11.0–15.0)
Total Lymphocyte: 27.3 %
WBC: 5.5 10*3/uL (ref 3.8–10.8)

## 2020-08-13 LAB — IRON, TOTAL/TOTAL IRON BINDING CAP
%SAT: 9 % (calc) — ABNORMAL LOW (ref 16–45)
Iron: 33 ug/dL — ABNORMAL LOW (ref 40–190)
TIBC: 377 mcg/dL (calc) (ref 250–450)

## 2020-08-13 LAB — HEMOGLOBIN A1C
Hgb A1c MFr Bld: 6.1 % of total Hgb — ABNORMAL HIGH (ref <5.7)
Mean Plasma Glucose: 128 mg/dL
eAG (mmol/L): 7.1 mmol/L

## 2020-08-13 LAB — VITAMIN D 25 HYDROXY (VIT D DEFICIENCY, FRACTURES): Vit D, 25-Hydroxy: 23 ng/mL — ABNORMAL LOW (ref 30–100)

## 2020-08-13 LAB — TSH: TSH: 1.61 mIU/L

## 2020-08-13 LAB — NO CULTURE INDICATED

## 2020-08-14 ENCOUNTER — Other Ambulatory Visit: Payer: Self-pay | Admitting: Adult Health Nurse Practitioner

## 2020-08-14 MED ORDER — CHOLECALCIFEROL 1.25 MG (50000 UT) PO CAPS: ORAL_CAPSULE | ORAL | 0 refills | Status: AC

## 2020-08-15 ENCOUNTER — Encounter: Payer: BLUE CROSS/BLUE SHIELD | Admitting: Adult Health Nurse Practitioner

## 2021-02-12 IMAGING — CT CT HEAD WO/W CM
4 of 5 series · 16 of 47 positions shown, 18 images · IV contrast (iopamidol)
Comparison: None.

CLINICAL DATA: Headaches

EXAM:
CT HEAD WITHOUT AND WITH CONTRAST
TECHNIQUE: Contiguous axial images were obtained from the base of the skull
through the vertex without and with intravenous contrast
CONTRAST:  75mL FM4BZG-KPP IOPAMIDOL (FM4BZG-KPP) INJECTION 61%

[Series 2: brain 5.00 hr40 s3 ibhc · axial · 0.41mm/px · z∈[-611,-496]mm · 8 of 31 slices shown, 10 images]
[im 4/31  brain]
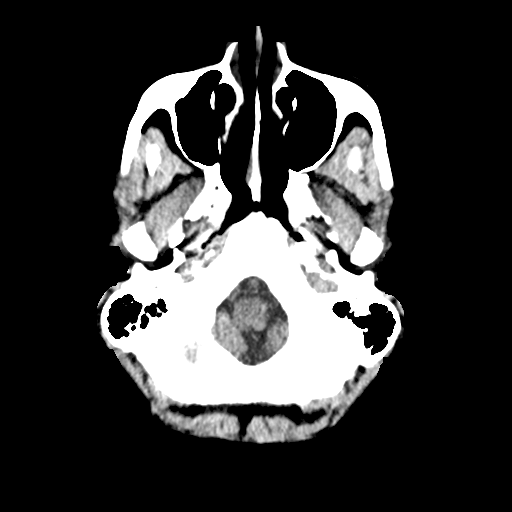
[im 4/31  bone]
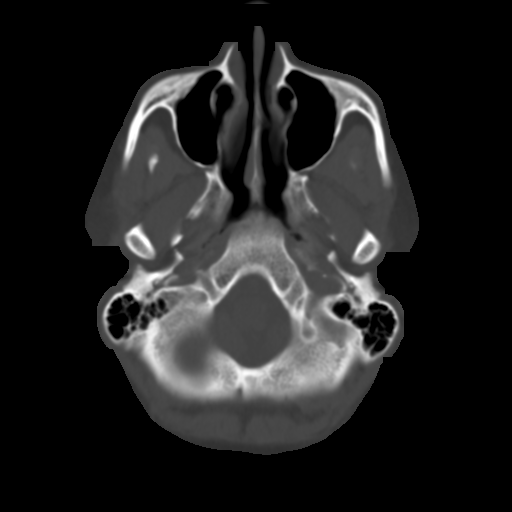
[im 7/31  brain]
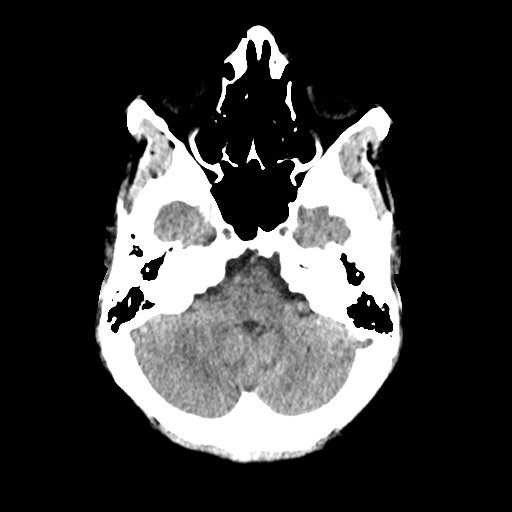
[im 11/31  brain]
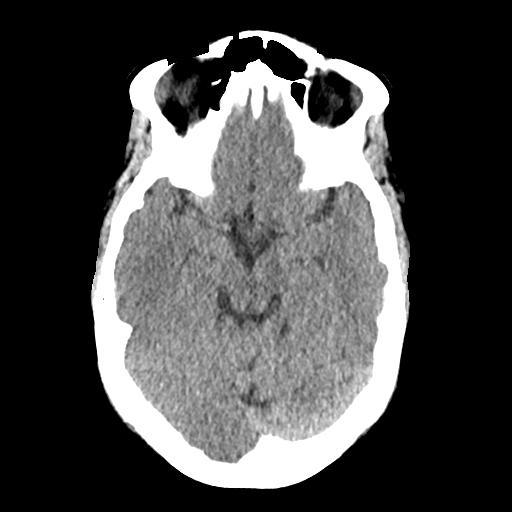
[im 14/31  brain]
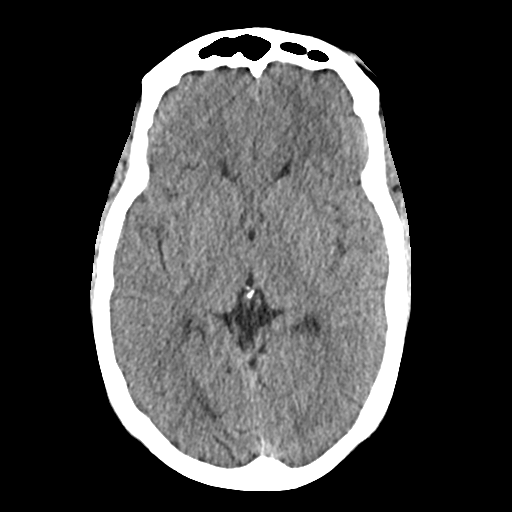
[im 17/31  brain]
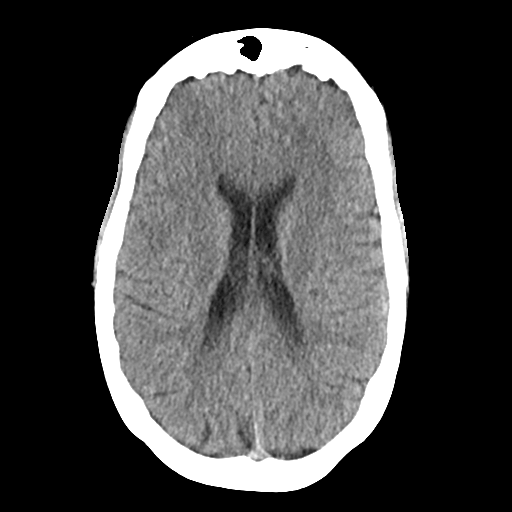
[im 17/31  bone]
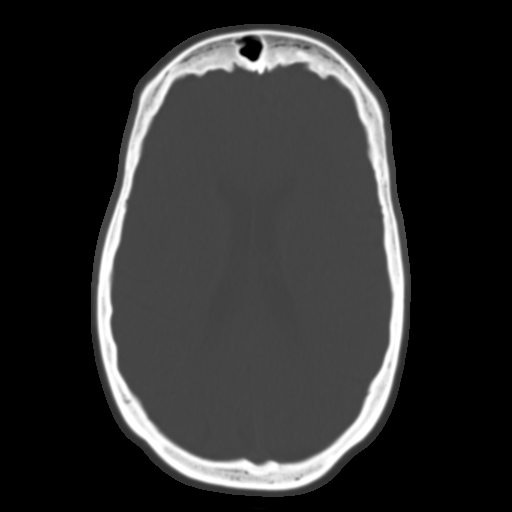
[im 21/31  brain]
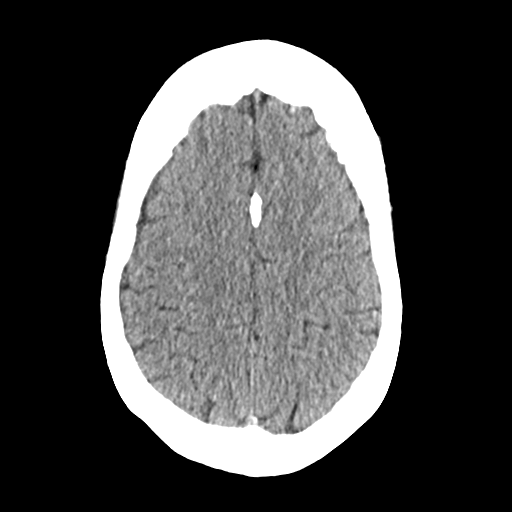
[im 24/31  brain]
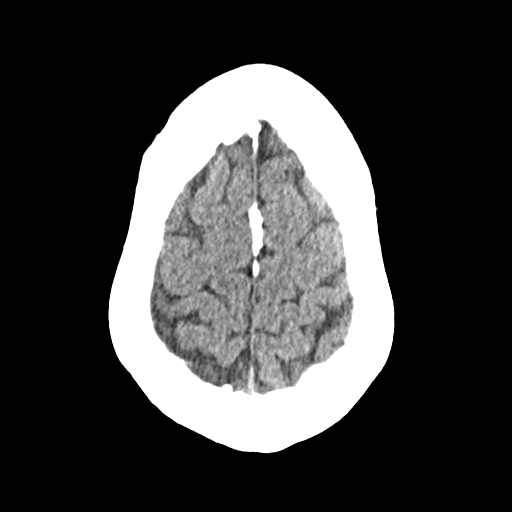
[im 27/31  brain]
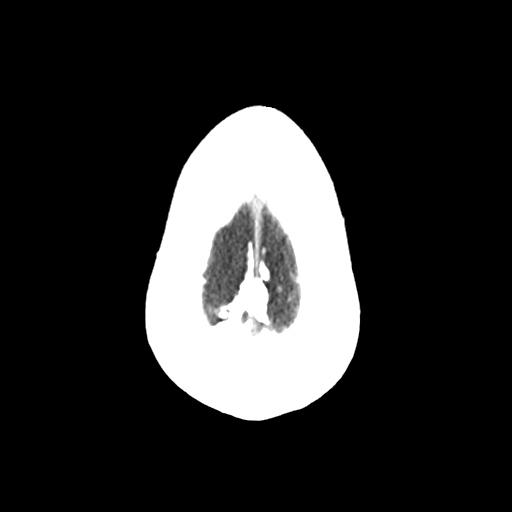

[Series 3: brain 5.00 hr60 s3 axial · axial · 0.41mm/px · z∈[-611,-596]mm · 2 of 31 slices shown]
[im 4/31  brain]
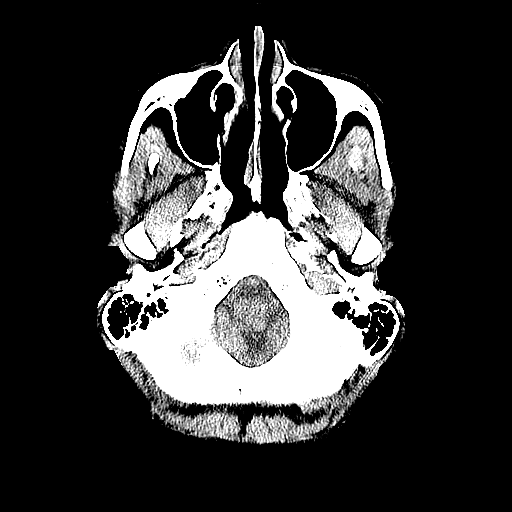
[im 7/31  brain]
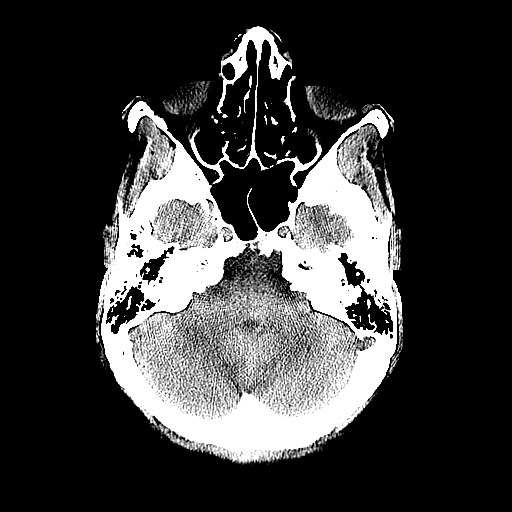

[Series 5: brain 3.00 hr40 s3 cor ibhc · coronal · 0.28mm/px · 3 of 69 slices shown]
[im 23/69  brain]
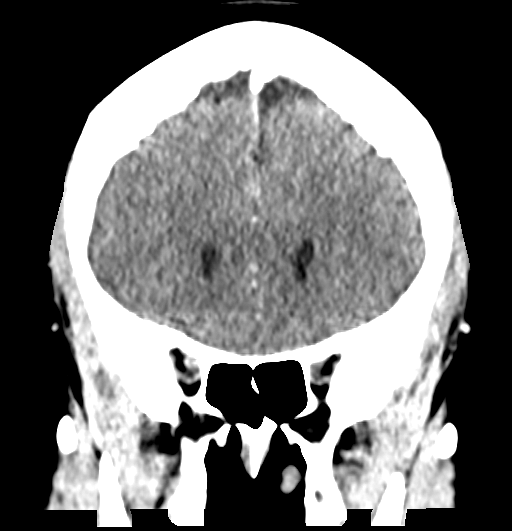
[im 31/69  brain]
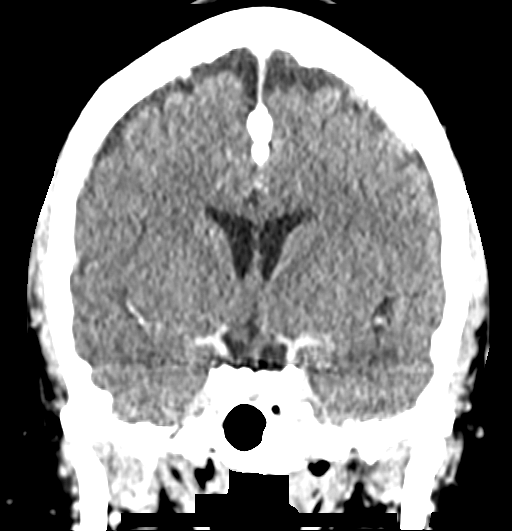
[im 38/69  brain]
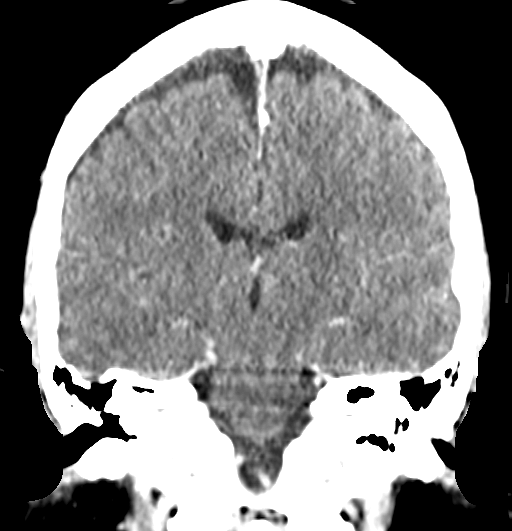

[Series 7: brain 3.00 hr40 s3 sag ibhc · sagittal · 0.29mm/px · 3 of 55 slices shown]
[im 19/55  brain]
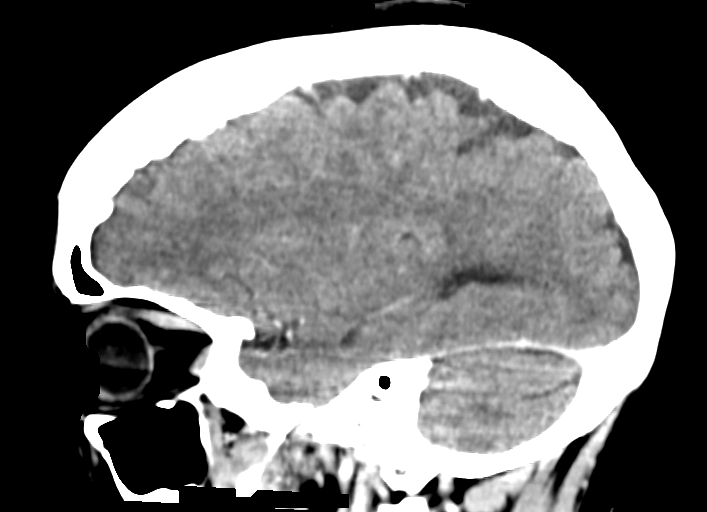
[im 28/55  brain]
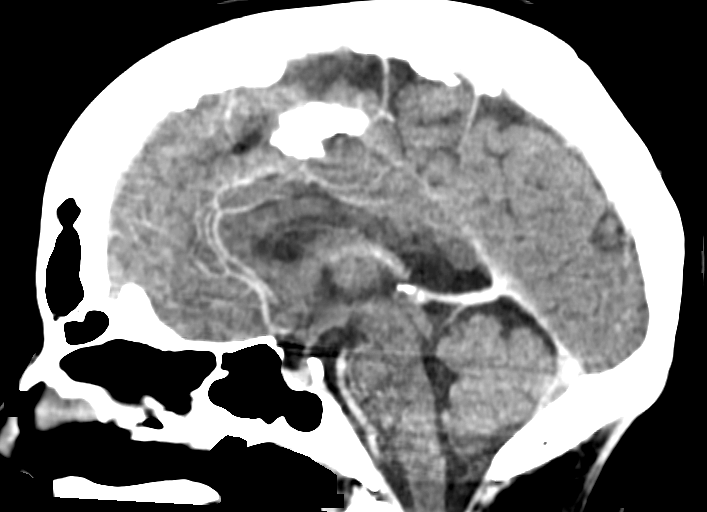
[im 37/55  brain]
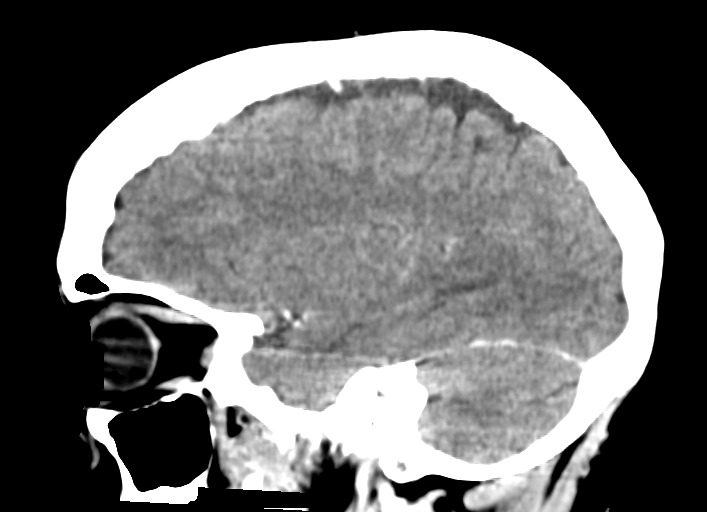

[16 of 47 positions shown; findings below may reference images not displayed]

FINDINGS: Brain: There is no mass, hemorrhage or extra-axial collection. The
size and configuration of the ventricles and extra-axial CSF spaces
are normal. The brain parenchyma is normal, without acute or chronic
infarction. No abnormal enhancement.

Vascular: No abnormal hyperdensity of the major intracranial
arteries or dural venous sinuses. No intracranial atherosclerosis.

Skull: The visualized skull base, calvarium and extracranial soft
tissues are normal.

Sinuses/Orbits: No fluid levels or advanced mucosal thickening of
the visualized paranasal sinuses. No mastoid or middle ear effusion.
The orbits are normal.
IMPRESSION: Normal head CT.

## 2021-07-10 ENCOUNTER — Other Ambulatory Visit: Payer: Self-pay | Admitting: Adult Health Nurse Practitioner

## 2021-07-10 DIAGNOSIS — F419 Anxiety disorder, unspecified: Secondary | ICD-10-CM

## 2021-08-11 NOTE — Progress Notes (Unsigned)
Complete Physical  Assessment and Plan:  Encounter for general adult medical examination with abnormal findings Yearly Due for Mammogram, discussed with patient  Iron deficiency anemia, unspecified iron deficiency anemia type -     Iron and TIBC Taking Slow Fe no consistantly  Hyperlipidemia, unspecified hyperlipidemia type -No medications, check lipids, decrease fatty foods, increase activity.  -     Lipid panel  Vitamin D deficiency Continue supplementation to maintain goal of 70-100 Taking Vitamin D 1,000 IU 3tablets daily -     VITAMIN D 25 Hydroxy (Vit-D Deficiency, Fractures)  Low grade squamous intraepithelial lesion (LGSIL) on Papanicolaou smear of cervix -  GYN in [place] - normal  Prediabetes Discussed dietary and exercise modifications - recheck  Gastroesophageal reflux disease, esophagitis presence not specified Continue PPI/H2 blocker, diet discussed No medication at this time  HSV-1 infection Continue acyclovir PRN  BMI  - long discussion about weight loss, diet, and exercise  Medication management Continued  Discussed med's effects and SE's. Screening labs and tests as requested with regular follow-up as recommended. Over 40 minutes of exam, counseling, chart review, and complex, high level critical decision making was performed this visit.     HPI  46 y.o. female  presents for a complete physical and follow up for has Prediabetes; Obesity, unspecified; Iron deficiency anemia; Hyperlipidemia; Medication management; Vitamin D deficiency; HSV-1 infection; GERD (gastroesophageal reflux disease); and Low grade squamous intraepithelial lesion (LGSIL) on Papanicolaou smear of cervix on their problem list..  She is having headaches to the right back side of her head.  She reports that is has increased in frequency over the course of the past two weeks. Occurrence is every other day.  She reports that are intermittent in nature.  She previously used  sumatriptan but has been out of medication.  Her blood pressure has been controlled at home, today their BP is   She does not workout. She denies chest pain, shortness of breath, dizziness.   Follows with GYN in [place] now, had PAP last year that was normal.    She has never had screening mammogram.  Discussed with patient at length today.  Has family history of breast cancer.  Patient to call to schedule this.  She is not on cholesterol medication and denies myalgias. Her cholesterol is not at goal. The cholesterol last visit was not to goal:   Lab Results  Component Value Date   CHOL 226 (H) 08/11/2018   HDL 45 (L) 08/11/2018   LDLCALC 155 (H) 08/11/2018   TRIG 132 08/11/2018   CHOLHDL 5.0 (H) 08/11/2018   She has been working on diet and exercise for prediabetes,   and denies paresthesia of the feet, polydipsia, polyuria and visual disturbances. Last A1C in the office was:  Lab Results  Component Value Date   HGBA1C 6.1 (H) 08/12/2020   Patient is on Vitamin D supplement, 10,000 IU a day.   Lab Results  Component Value Date   VD25OH 23 (L) 08/12/2020     BMI is There is no height or weight on file to calculate BMI., she is working on diet and exercise. Wt Readings from Last 3 Encounters:  08/12/20 209 lb (94.8 kg)  03/13/20 199 lb (90.3 kg)  02/27/20 202 lb (91.6 kg)   She has iron def anemia Lab Results  Component Value Date   IRON 33 (L) 08/12/2020   TIBC 377 08/12/2020   FERRITIN 17 08/11/2018    Current Medications:  Current Outpatient Medications on  File Prior to Visit  Medication Sig Dispense Refill   acyclovir (ZOVIRAX) 400 MG tablet TAKE 1 TABLET(400 MG) BY MOUTH DAILY 90 tablet 3   Cholecalciferol 1.25 MG (50000 UT) capsule Take one tablet by mouth three days a week for twelve weeks. 36 capsule 0   sertraline (ZOLOFT) 50 MG tablet TAKE 1 TABLET(50 MG) BY MOUTH AT BEDTIME 90 tablet 1   SUMAtriptan (IMITREX) 50 MG tablet Take 1 tablet (50 mg total) by  mouth once as needed for migraine. May repeat in 2 hours if headache persists or recurs. 10 tablet 2   No current facility-administered medications on file prior to visit.   Allergies:  Allergies  Allergen Reactions   Benadryl [Diphenhydramine] Rash   Medical History:  She has Prediabetes; Obesity, unspecified; Iron deficiency anemia; Hyperlipidemia; Medication management; Vitamin D deficiency; HSV-1 infection; GERD (gastroesophageal reflux disease); and Low grade squamous intraepithelial lesion (LGSIL) on Papanicolaou smear of cervix on their problem list.   Health Maintenance:   Immunization History  Administered Date(s) Administered   Influenza Inj Mdck Quad Pf 07/12/2017   Influenza-Unspecified 07/12/2017   PFIZER Comirnaty(Gray Top)Covid-19 Tri-Sucrose Vaccine 09/28/2019, 10/21/2019, 05/25/2020   Tdap 04/01/2015   Tetanus: 2016 Pneumovax: 1997 Prevnar 13: N/A Flu vaccine: 2019 Zostavax: N/A LMP: N/A  Pap: at GYN in [place] MGM: April 2019 in [place] DEXA: N/A Colonoscopy: N/A EGD: N/A  Patient Care Team: Lucky Cowboy, MD as PCP - General (Internal Medicine)  Surgical History:  She has no past surgical history on file.   Family History:  Herfamily history includes Asthma in her mother; Cancer in her father, maternal grandmother, and paternal aunt; Heart disease in her maternal grandfather; Hypertension in her father and mother; Stroke in her maternal grandmother.   Social History:  She reports that she has never smoked. She has never used smokeless tobacco. She reports that she does not drink alcohol and does not use drugs.  Review of Systems: Review of Systems  Constitutional: Negative.  Negative for chills and fever.  HENT: Negative.  Negative for congestion, hearing loss, sinus pain, sore throat and tinnitus.   Eyes: Negative.  Negative for blurred vision and double vision.  Respiratory: Negative.  Negative for cough, hemoptysis, sputum production,  shortness of breath and wheezing.   Cardiovascular: Negative.  Negative for chest pain, palpitations and leg swelling.  Gastrointestinal: Negative.  Negative for abdominal pain, constipation, diarrhea, heartburn, nausea and vomiting.  Genitourinary:  Negative for dysuria, frequency and urgency.  Musculoskeletal:  Positive for neck pain. Negative for back pain, falls, joint pain and myalgias.  Skin: Negative.  Negative for rash.  Neurological:  Negative for dizziness, tingling, tremors, weakness and headaches.  Endo/Heme/Allergies:  Does not bruise/bleed easily.  Psychiatric/Behavioral:  Negative for depression and suicidal ideas. The patient is not nervous/anxious and does not have insomnia.    Physical Exam: Estimated body mass index is 32.73 kg/m as calculated from the following:   Height as of 08/12/20: 5\' 7"  (1.702 m).   Weight as of 08/12/20: 209 lb (94.8 kg). There were no vitals taken for this visit.  General Appearance: Well nourished, in no apparent distress.  Eyes: PERRLA, EOMs, conjunctiva no swelling or erythema, normal fundi and vessels.  Sinuses: No Frontal/maxillary tenderness  ENT/Mouth: Ext aud canals clear, normal light reflex with TMs without erythema, bulging. Good dentition. No erythema, swelling, or exudate on post pharynx. Tonsils not swollen or erythematous. Hearing normal.  Neck: Supple, thyroid normal. No bruits  Respiratory: Respiratory effort  normal, BS equal bilaterally without rales, rhonchi, wheezing or stridor.  Cardio: RRR without murmurs, rubs or gallops. Brisk peripheral pulses without edema.  Chest: symmetric, with normal excursions and percussion.  Breasts: Symmetric, without lumps, nipple discharge, retractions.  Abdomen: Soft, nontender, no guarding, rebound, hernias, masses, or organomegaly.  Lymphatics: Non tender without lymphadenopathy.  Gen: defer Musculoskeletal: Full ROM all peripheral extremities,5/5 strength, and normal gait. supple, without  spinous process tenderness, with paraspinal muscle tenderness the right side, normal sensation, reflexes, and pulses distal. Skin: Warm, dry without rashes, lesions, ecchymosis. Neuro: Cranial nerves intact, reflexes equal bilaterally. Normal muscle tone, no cerebellar symptoms. Sensation intact.  Psych: Awake and oriented X 3, normal affect, Insight and Judgment appropriate.     Elder Negus, Edrick Oh, DNP [facility] 08/11/2021  8:49 AM

## 2021-08-12 ENCOUNTER — Encounter: Payer: BC Managed Care – PPO | Admitting: Nurse Practitioner

## 2021-08-12 DIAGNOSIS — G43009 Migraine without aura, not intractable, without status migrainosus: Secondary | ICD-10-CM

## 2021-08-12 DIAGNOSIS — D509 Iron deficiency anemia, unspecified: Secondary | ICD-10-CM

## 2021-08-12 DIAGNOSIS — Z1389 Encounter for screening for other disorder: Secondary | ICD-10-CM

## 2021-08-12 DIAGNOSIS — F419 Anxiety disorder, unspecified: Secondary | ICD-10-CM

## 2021-08-12 DIAGNOSIS — Z0001 Encounter for general adult medical examination with abnormal findings: Secondary | ICD-10-CM

## 2021-08-12 DIAGNOSIS — Z136 Encounter for screening for cardiovascular disorders: Secondary | ICD-10-CM

## 2021-08-12 DIAGNOSIS — Z79899 Other long term (current) drug therapy: Secondary | ICD-10-CM

## 2021-08-12 DIAGNOSIS — R7309 Other abnormal glucose: Secondary | ICD-10-CM

## 2021-08-12 DIAGNOSIS — E559 Vitamin D deficiency, unspecified: Secondary | ICD-10-CM

## 2021-08-12 DIAGNOSIS — E785 Hyperlipidemia, unspecified: Secondary | ICD-10-CM

## 2021-12-11 ENCOUNTER — Other Ambulatory Visit: Payer: Self-pay | Admitting: Adult Health

## 2021-12-11 DIAGNOSIS — F419 Anxiety disorder, unspecified: Secondary | ICD-10-CM

## 2022-08-12 ENCOUNTER — Encounter: Payer: BC Managed Care – PPO | Admitting: Nurse Practitioner
